# Patient Record
Sex: Female | Born: 1950 | Race: Black or African American | Hispanic: No
Health system: Southern US, Community
[De-identification: ages and names within clinical notes are randomized; demographics above are authoritative.]

## PROBLEM LIST (undated history)

## (undated) DIAGNOSIS — I10 Essential (primary) hypertension: Secondary | ICD-10-CM

## (undated) DIAGNOSIS — N289 Disorder of kidney and ureter, unspecified: Secondary | ICD-10-CM

---

## 2017-12-30 ENCOUNTER — Emergency Department (HOSPITAL_COMMUNITY): Payer: Medicare PPO

## 2017-12-30 ENCOUNTER — Emergency Department (HOSPITAL_COMMUNITY)
Admission: EM | Admit: 2017-12-30 | Discharge: 2017-12-30 | Disposition: A | Discharge status: Home / Self Care | Payer: Medicare PPO | Attending: Physician Assistant | Admitting: Physician Assistant

## 2017-12-30 ENCOUNTER — Encounter (HOSPITAL_COMMUNITY): Payer: Self-pay | Admitting: Emergency Medicine

## 2017-12-30 DIAGNOSIS — I10 Essential (primary) hypertension: Secondary | ICD-10-CM | POA: Diagnosis not present

## 2017-12-30 DIAGNOSIS — M25562 Pain in left knee: Secondary | ICD-10-CM

## 2017-12-30 HISTORY — DX: Essential (primary) hypertension: I10

## 2017-12-30 HISTORY — DX: Disorder of kidney and ureter, unspecified: N28.9

## 2017-12-30 NOTE — ED Triage Notes (Signed)
Patient presents to ED for assessment after a trip and fall where patient landed on her left knee.  Now states she cannot bear  Weight on it.  Minor swelling noted

## 2017-12-30 NOTE — Discharge Instructions (Signed)
It was my pleasure taking care of you today!   Tylenol as needed for pain. Rest the knee. Ice and elevate knee throughout the day.  If symptoms are not improving by Wednesday or Thursday, call the orthopedist (bone) doctor listed to schedule a follow up appointment.  Return to the ER for new or worsening symptoms, any additional concerns.

## 2017-12-30 NOTE — ED Provider Notes (Signed)
[facility] EMERGENCY [facility]PARTMENT Provider Note   CSN: [id number] Arrival date & time: 12/30/17  1451     History   Chief Complaint Chief Complaint  Patient presents with  . Knee Pain    HPI Roxan Dieselda Thilges is a 67 y.o. female.  The history is provided by the patient and medical records. No language interpreter was used.  Knee Pain     Christella Scheuermannda Samuel BoucheLucas is a 67 y.o. female  with a PMH of HTN who presents to the Emergency Department complaining of acute onset of left knee pain after she tripped today.  She states that her knee twisted oddly.  She did not fall to the ground.  She did not hit her head.  No loss of consciousness.  No medications taken prior to arrival for her symptoms.  Has not tried to walk on the leg since tripping.  Denies any swelling.  No medications taken prior to arrival for symptoms.  Denies history of prior injuries to the left lower extremity.  No numbness or tingling.  No weakness.   Past Medical History:  Diagnosis Date  . Hypertension   . Renal disorder    Kidney transplant 2013    There are no active problems to display for this patient.   History reviewed. No pertinent surgical history.   OB History   None      Home Medications    Prior to Admission medications   Not on File    Family History History reviewed. No pertinent family history.  Social History Social History   Tobacco Use  . Smoking status: Never Smoker  . Smokeless tobacco: Never Used  Substance Use Topics  . Alcohol use: Never    Frequency: Never  . Drug use: Never     Allergies   Aspirin and Penicillins   Review of Systems Review of Systems  Musculoskeletal: Positive for arthralgias. Negative for joint swelling.  Skin: Negative for color change and wound.  Neurological: Negative for syncope, weakness and headaches.    Physical Exam Updated Vital Signs BP (!) 152/73 (BP Location: Left Arm)   Pulse 81   Temp 98.4 F (36.9 C) (Oral)   Resp 16    SpO2 100%   Physical Exam  Constitutional: She appears well-developed and well-nourished. No distress.  HENT:  Head: Normocephalic and atraumatic.  Neck: Neck supple.  Cardiovascular: Normal rate, regular rhythm and normal heart sounds.  No murmur heard. Pulmonary/Chest: Effort normal and breath sounds normal. No respiratory distress. She has no wheezes. She has no rales.  Musculoskeletal:  Tenderness palpation of the left medial knee. Minimal swelling noted.  No tenderness to hips or ankle. Full range of motion and 5/5 strength. No abnormal alignment or patellar mobility. No bruising, erythema or warmth overlaying the joint. No varus/valgus laxity. Negative drawer's, Lachman's and McMurray's.  No crepitus. 2+ DP pulses bilaterally. All compartments are soft. Sensation intact distal to injury.  Neurological: She is alert.  Skin: Skin is warm and dry.  Nursing note and vitals reviewed.    ED Treatments / Results  Labs (all labs ordered are listed, but only abnormal results are displayed) Labs Reviewed - No data to display  EKG None  Radiology Dg Knee Complete 4 Views Left  Result Date: 12/30/2017 CLINICAL DATA:  Left knee pain following a fall on a carpeted floor today. EXAM: LEFT KNEE - COMPLETE 4+ VIEW COMPARISON:  None. FINDINGS: Moderate anterior patellar spur formation. No fracture, dislocation or effusion. IMPRESSION:  No fracture. Electronically Signed   By: Beckie Salts M.D.   On: 12/30/2017 15:53    Procedures Procedures (including critical care time)  Medications Ordered in ED Medications - No data to display   Initial Impression / Assessment and Plan / ED Course  I have reviewed the triage vital signs and the nursing notes.  Pertinent labs & imaging results that were available during my care of the patient were reviewed by me and considered in my medical decision making (see chart for details).    Janyia Guion is a 67 y.o. female who presents to ED for acute onset  of left knee pain after twisting knee oddly when she tripped just prior to arrival.  Neurovascularly intact on exam.  Ligaments appear intact on exam.  X-ray negative.  Patient with full range of motion and 5/5 strength.  Symptomatic home care instructions discussed.  Orthopedic follow-up if symptoms persist.  Reasons to return to ER discussed and all questions answered.   Final Clinical Impressions(s) / ED Diagnoses   Final diagnoses:  Acute pain of left knee    ED Discharge Orders    None       Keigan Tafoya, Chase Picket, PA-C 12/30/17 1643    Abelino Derrick, MD 01/02/18 [phone]

## 2018-07-31 DIAGNOSIS — Z79899 Other long term (current) drug therapy: Secondary | ICD-10-CM | POA: Diagnosis not present

## 2018-07-31 DIAGNOSIS — I129 Hypertensive chronic kidney disease with stage 1 through stage 4 chronic kidney disease, or unspecified chronic kidney disease: Secondary | ICD-10-CM | POA: Diagnosis not present

## 2018-07-31 DIAGNOSIS — F209 Schizophrenia, unspecified: Secondary | ICD-10-CM | POA: Diagnosis not present

## 2018-07-31 DIAGNOSIS — E559 Vitamin D deficiency, unspecified: Secondary | ICD-10-CM | POA: Diagnosis not present

## 2018-07-31 DIAGNOSIS — D631 Anemia in chronic kidney disease: Secondary | ICD-10-CM | POA: Diagnosis not present

## 2018-07-31 DIAGNOSIS — Z940 Kidney transplant status: Secondary | ICD-10-CM | POA: Diagnosis not present

## 2018-07-31 DIAGNOSIS — E1129 Type 2 diabetes mellitus with other diabetic kidney complication: Secondary | ICD-10-CM | POA: Diagnosis not present

## 2018-07-31 DIAGNOSIS — N189 Chronic kidney disease, unspecified: Secondary | ICD-10-CM | POA: Diagnosis not present

## 2018-08-01 DIAGNOSIS — F0390 Unspecified dementia without behavioral disturbance: Secondary | ICD-10-CM | POA: Diagnosis not present

## 2018-08-01 DIAGNOSIS — F209 Schizophrenia, unspecified: Secondary | ICD-10-CM | POA: Diagnosis not present

## 2018-08-01 DIAGNOSIS — Z940 Kidney transplant status: Secondary | ICD-10-CM | POA: Diagnosis not present

## 2018-08-01 DIAGNOSIS — K219 Gastro-esophageal reflux disease without esophagitis: Secondary | ICD-10-CM | POA: Diagnosis not present

## 2018-08-01 DIAGNOSIS — D649 Anemia, unspecified: Secondary | ICD-10-CM | POA: Diagnosis not present

## 2018-08-01 DIAGNOSIS — N183 Chronic kidney disease, stage 3 (moderate): Secondary | ICD-10-CM | POA: Diagnosis not present

## 2018-08-16 DIAGNOSIS — Z940 Kidney transplant status: Secondary | ICD-10-CM | POA: Diagnosis not present

## 2018-08-16 DIAGNOSIS — N186 End stage renal disease: Secondary | ICD-10-CM | POA: Diagnosis not present

## 2018-08-19 DIAGNOSIS — Z940 Kidney transplant status: Secondary | ICD-10-CM | POA: Diagnosis not present

## 2018-08-28 DIAGNOSIS — F205 Residual schizophrenia: Secondary | ICD-10-CM | POA: Diagnosis not present

## 2018-08-29 DIAGNOSIS — R05 Cough: Secondary | ICD-10-CM | POA: Diagnosis not present

## 2018-08-29 DIAGNOSIS — N189 Chronic kidney disease, unspecified: Secondary | ICD-10-CM | POA: Diagnosis not present

## 2018-08-29 DIAGNOSIS — F0390 Unspecified dementia without behavioral disturbance: Secondary | ICD-10-CM | POA: Diagnosis not present

## 2018-08-29 DIAGNOSIS — F209 Schizophrenia, unspecified: Secondary | ICD-10-CM | POA: Diagnosis not present

## 2018-08-29 DIAGNOSIS — I10 Essential (primary) hypertension: Secondary | ICD-10-CM | POA: Diagnosis not present

## 2018-08-29 DIAGNOSIS — Z940 Kidney transplant status: Secondary | ICD-10-CM | POA: Diagnosis not present

## 2018-09-26 DIAGNOSIS — Z940 Kidney transplant status: Secondary | ICD-10-CM | POA: Diagnosis not present

## 2018-09-26 DIAGNOSIS — K219 Gastro-esophageal reflux disease without esophagitis: Secondary | ICD-10-CM | POA: Diagnosis not present

## 2018-09-26 DIAGNOSIS — F209 Schizophrenia, unspecified: Secondary | ICD-10-CM | POA: Diagnosis not present

## 2018-09-26 DIAGNOSIS — F0390 Unspecified dementia without behavioral disturbance: Secondary | ICD-10-CM | POA: Diagnosis not present

## 2018-09-26 DIAGNOSIS — J309 Allergic rhinitis, unspecified: Secondary | ICD-10-CM | POA: Diagnosis not present

## 2018-09-30 DIAGNOSIS — B351 Tinea unguium: Secondary | ICD-10-CM | POA: Diagnosis not present

## 2018-09-30 DIAGNOSIS — I739 Peripheral vascular disease, unspecified: Secondary | ICD-10-CM | POA: Diagnosis not present

## 2018-10-16 DIAGNOSIS — Z940 Kidney transplant status: Secondary | ICD-10-CM | POA: Diagnosis not present

## 2018-10-21 DIAGNOSIS — E559 Vitamin D deficiency, unspecified: Secondary | ICD-10-CM | POA: Diagnosis not present

## 2018-10-21 DIAGNOSIS — I129 Hypertensive chronic kidney disease with stage 1 through stage 4 chronic kidney disease, or unspecified chronic kidney disease: Secondary | ICD-10-CM | POA: Diagnosis not present

## 2018-10-21 DIAGNOSIS — F329 Major depressive disorder, single episode, unspecified: Secondary | ICD-10-CM | POA: Diagnosis not present

## 2018-10-21 DIAGNOSIS — F209 Schizophrenia, unspecified: Secondary | ICD-10-CM | POA: Diagnosis not present

## 2018-10-21 DIAGNOSIS — E1129 Type 2 diabetes mellitus with other diabetic kidney complication: Secondary | ICD-10-CM | POA: Diagnosis not present

## 2018-10-21 DIAGNOSIS — Z940 Kidney transplant status: Secondary | ICD-10-CM | POA: Diagnosis not present

## 2018-10-21 DIAGNOSIS — D72819 Decreased white blood cell count, unspecified: Secondary | ICD-10-CM | POA: Diagnosis not present

## 2018-10-21 DIAGNOSIS — Z79899 Other long term (current) drug therapy: Secondary | ICD-10-CM | POA: Diagnosis not present

## 2018-10-21 DIAGNOSIS — N189 Chronic kidney disease, unspecified: Secondary | ICD-10-CM | POA: Diagnosis not present

## 2018-11-25 DIAGNOSIS — Z940 Kidney transplant status: Secondary | ICD-10-CM | POA: Diagnosis not present

## 2018-11-28 DIAGNOSIS — Z940 Kidney transplant status: Secondary | ICD-10-CM | POA: Diagnosis not present

## 2018-11-28 DIAGNOSIS — I10 Essential (primary) hypertension: Secondary | ICD-10-CM | POA: Diagnosis not present

## 2018-11-28 DIAGNOSIS — F200 Paranoid schizophrenia: Secondary | ICD-10-CM | POA: Diagnosis not present

## 2018-11-28 DIAGNOSIS — N182 Chronic kidney disease, stage 2 (mild): Secondary | ICD-10-CM | POA: Diagnosis not present

## 2018-11-28 DIAGNOSIS — F0390 Unspecified dementia without behavioral disturbance: Secondary | ICD-10-CM | POA: Diagnosis not present

## 2018-12-04 DIAGNOSIS — F205 Residual schizophrenia: Secondary | ICD-10-CM | POA: Diagnosis not present

## 2018-12-12 DIAGNOSIS — F205 Residual schizophrenia: Secondary | ICD-10-CM | POA: Diagnosis not present

## 2018-12-16 DIAGNOSIS — Z940 Kidney transplant status: Secondary | ICD-10-CM | POA: Diagnosis not present

## 2019-01-02 DIAGNOSIS — F0390 Unspecified dementia without behavioral disturbance: Secondary | ICD-10-CM | POA: Diagnosis not present

## 2019-01-02 DIAGNOSIS — I10 Essential (primary) hypertension: Secondary | ICD-10-CM | POA: Diagnosis not present

## 2019-01-02 DIAGNOSIS — Z940 Kidney transplant status: Secondary | ICD-10-CM | POA: Diagnosis not present

## 2019-01-02 DIAGNOSIS — N182 Chronic kidney disease, stage 2 (mild): Secondary | ICD-10-CM | POA: Diagnosis not present

## 2019-01-02 DIAGNOSIS — F209 Schizophrenia, unspecified: Secondary | ICD-10-CM | POA: Diagnosis not present

## 2019-01-30 DIAGNOSIS — N182 Chronic kidney disease, stage 2 (mild): Secondary | ICD-10-CM | POA: Diagnosis not present

## 2019-01-30 DIAGNOSIS — F209 Schizophrenia, unspecified: Secondary | ICD-10-CM | POA: Diagnosis not present

## 2019-01-30 DIAGNOSIS — I10 Essential (primary) hypertension: Secondary | ICD-10-CM | POA: Diagnosis not present

## 2019-01-30 DIAGNOSIS — Z940 Kidney transplant status: Secondary | ICD-10-CM | POA: Diagnosis not present

## 2019-01-30 DIAGNOSIS — F0390 Unspecified dementia without behavioral disturbance: Secondary | ICD-10-CM | POA: Diagnosis not present

## 2019-02-14 DIAGNOSIS — Z940 Kidney transplant status: Secondary | ICD-10-CM | POA: Diagnosis not present

## 2019-02-25 DIAGNOSIS — Z1383 Encounter for screening for respiratory disorder NEC: Secondary | ICD-10-CM | POA: Diagnosis not present

## 2019-02-25 DIAGNOSIS — Z20828 Contact with and (suspected) exposure to other viral communicable diseases: Secondary | ICD-10-CM | POA: Diagnosis not present

## 2019-03-24 DIAGNOSIS — Z03818 Encounter for observation for suspected exposure to other biological agents ruled out: Secondary | ICD-10-CM | POA: Diagnosis not present

## 2019-03-26 DIAGNOSIS — E559 Vitamin D deficiency, unspecified: Secondary | ICD-10-CM | POA: Diagnosis not present

## 2019-03-26 DIAGNOSIS — F329 Major depressive disorder, single episode, unspecified: Secondary | ICD-10-CM | POA: Diagnosis not present

## 2019-03-26 DIAGNOSIS — E1129 Type 2 diabetes mellitus with other diabetic kidney complication: Secondary | ICD-10-CM | POA: Diagnosis not present

## 2019-03-26 DIAGNOSIS — Z79899 Other long term (current) drug therapy: Secondary | ICD-10-CM | POA: Diagnosis not present

## 2019-03-26 DIAGNOSIS — Z940 Kidney transplant status: Secondary | ICD-10-CM | POA: Diagnosis not present

## 2019-03-26 DIAGNOSIS — D72819 Decreased white blood cell count, unspecified: Secondary | ICD-10-CM | POA: Diagnosis not present

## 2019-03-26 DIAGNOSIS — F209 Schizophrenia, unspecified: Secondary | ICD-10-CM | POA: Diagnosis not present

## 2019-03-26 DIAGNOSIS — I129 Hypertensive chronic kidney disease with stage 1 through stage 4 chronic kidney disease, or unspecified chronic kidney disease: Secondary | ICD-10-CM | POA: Diagnosis not present

## 2019-03-28 DIAGNOSIS — F209 Schizophrenia, unspecified: Secondary | ICD-10-CM | POA: Diagnosis not present

## 2019-03-28 DIAGNOSIS — Z940 Kidney transplant status: Secondary | ICD-10-CM | POA: Diagnosis not present

## 2019-03-28 DIAGNOSIS — I10 Essential (primary) hypertension: Secondary | ICD-10-CM | POA: Diagnosis not present

## 2019-03-28 DIAGNOSIS — N182 Chronic kidney disease, stage 2 (mild): Secondary | ICD-10-CM | POA: Diagnosis not present

## 2019-03-28 DIAGNOSIS — F0390 Unspecified dementia without behavioral disturbance: Secondary | ICD-10-CM | POA: Diagnosis not present

## 2019-04-02 IMAGING — DX DG KNEE COMPLETE 4+V*L*
4 series · 4 of 4 positions shown · non-contrast
Comparison: None.

CLINICAL DATA: Left knee pain following a fall on a carpeted floor
today.

EXAM:
LEFT KNEE - COMPLETE 4+ VIEW

[t knee ap left]
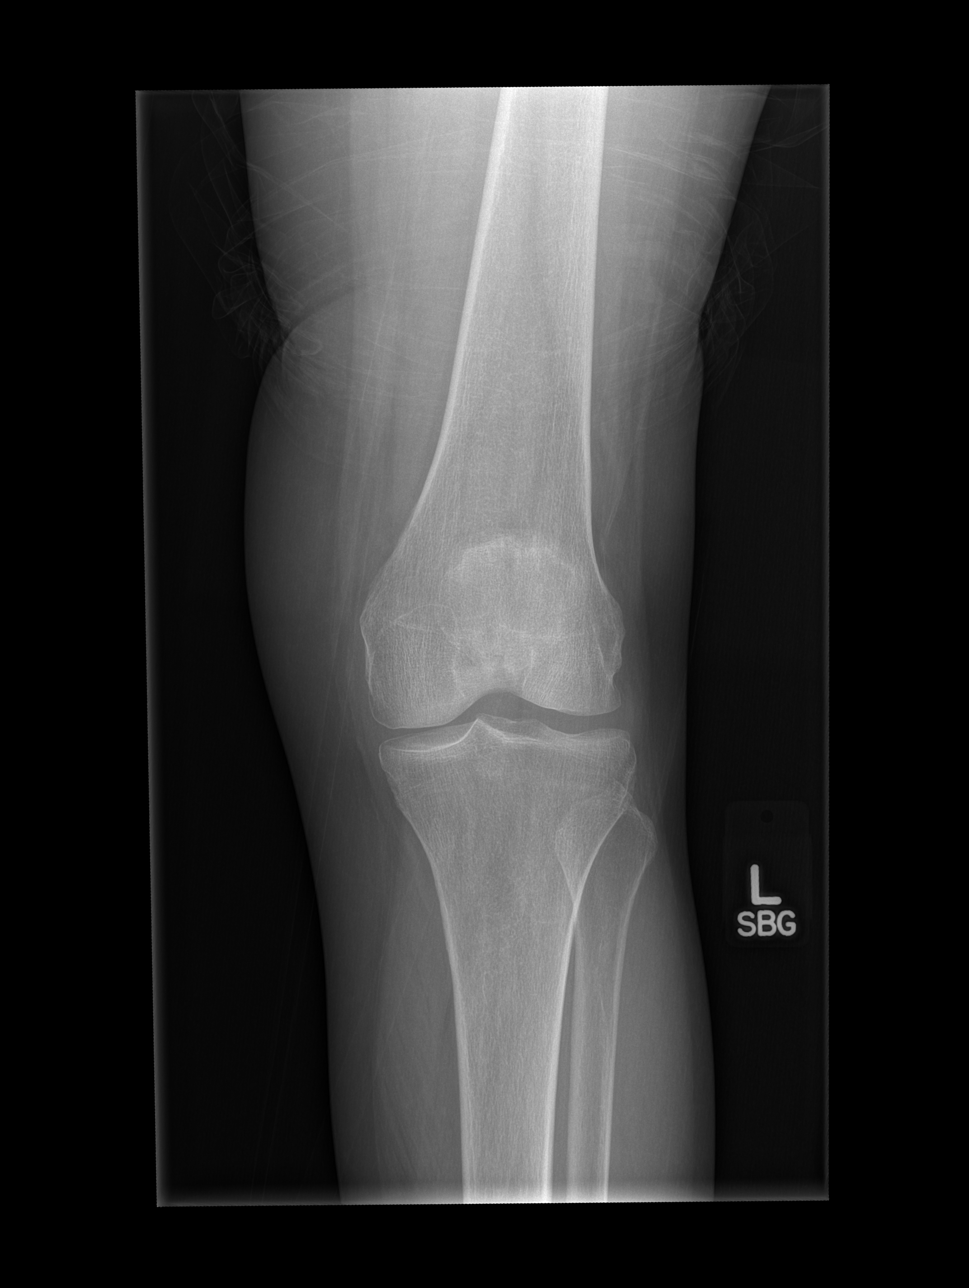

[t knee obl left (1 of 2)]
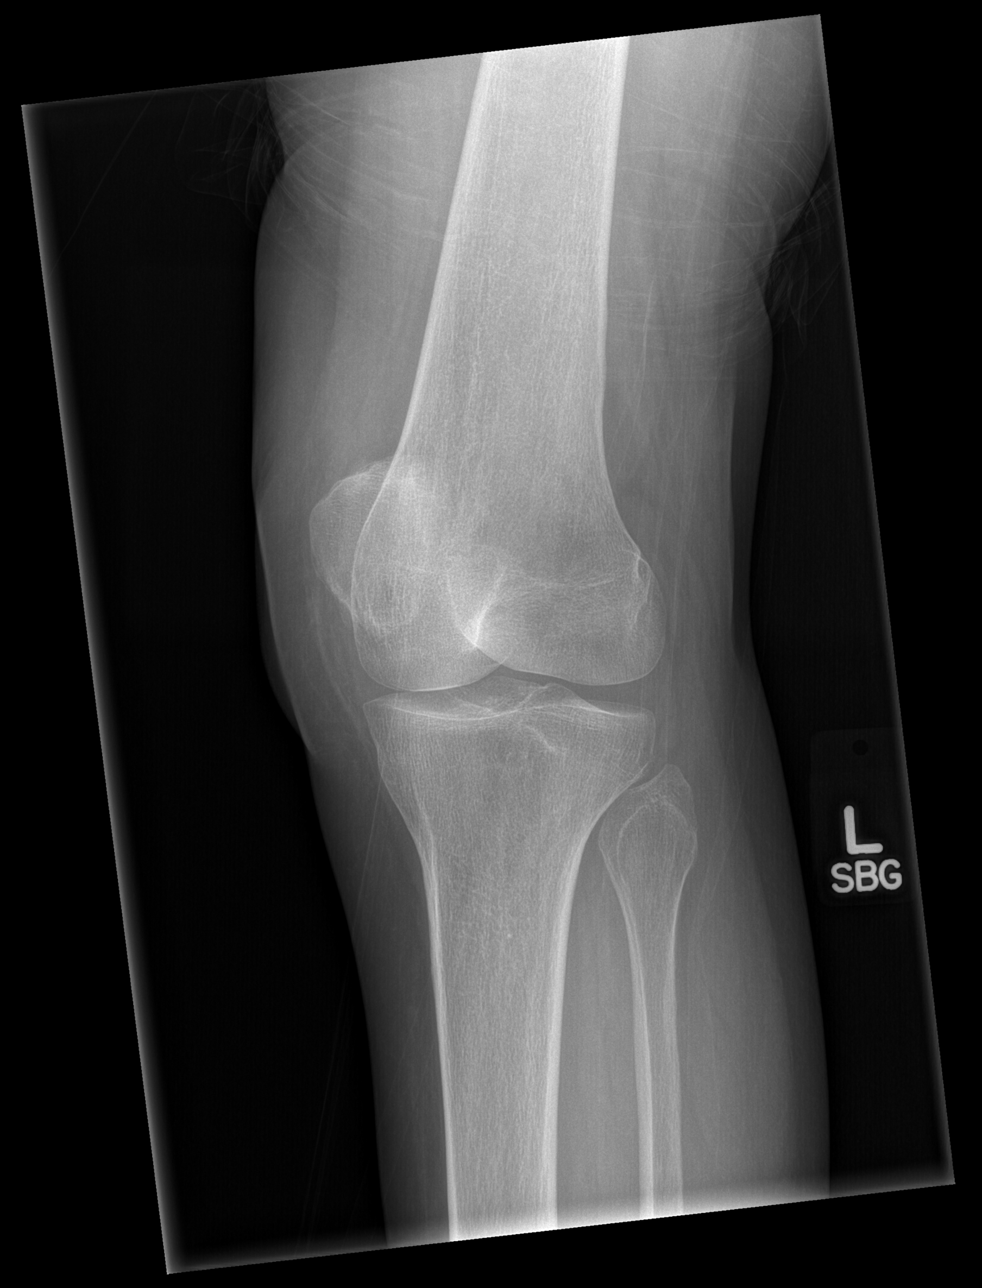

[t knee obl left (2 of 2)]
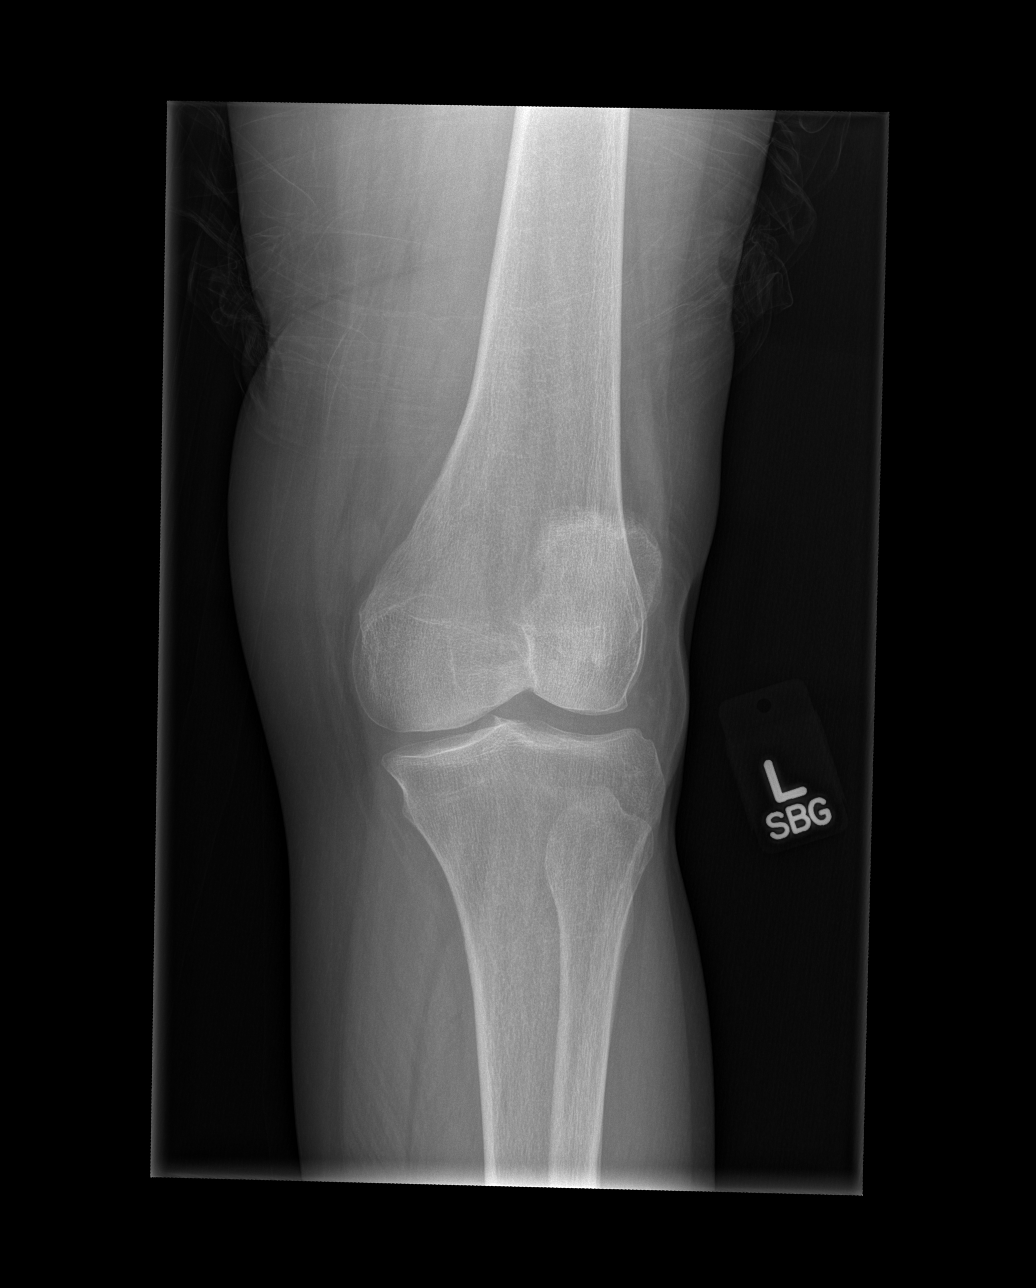

[t knee lat left]
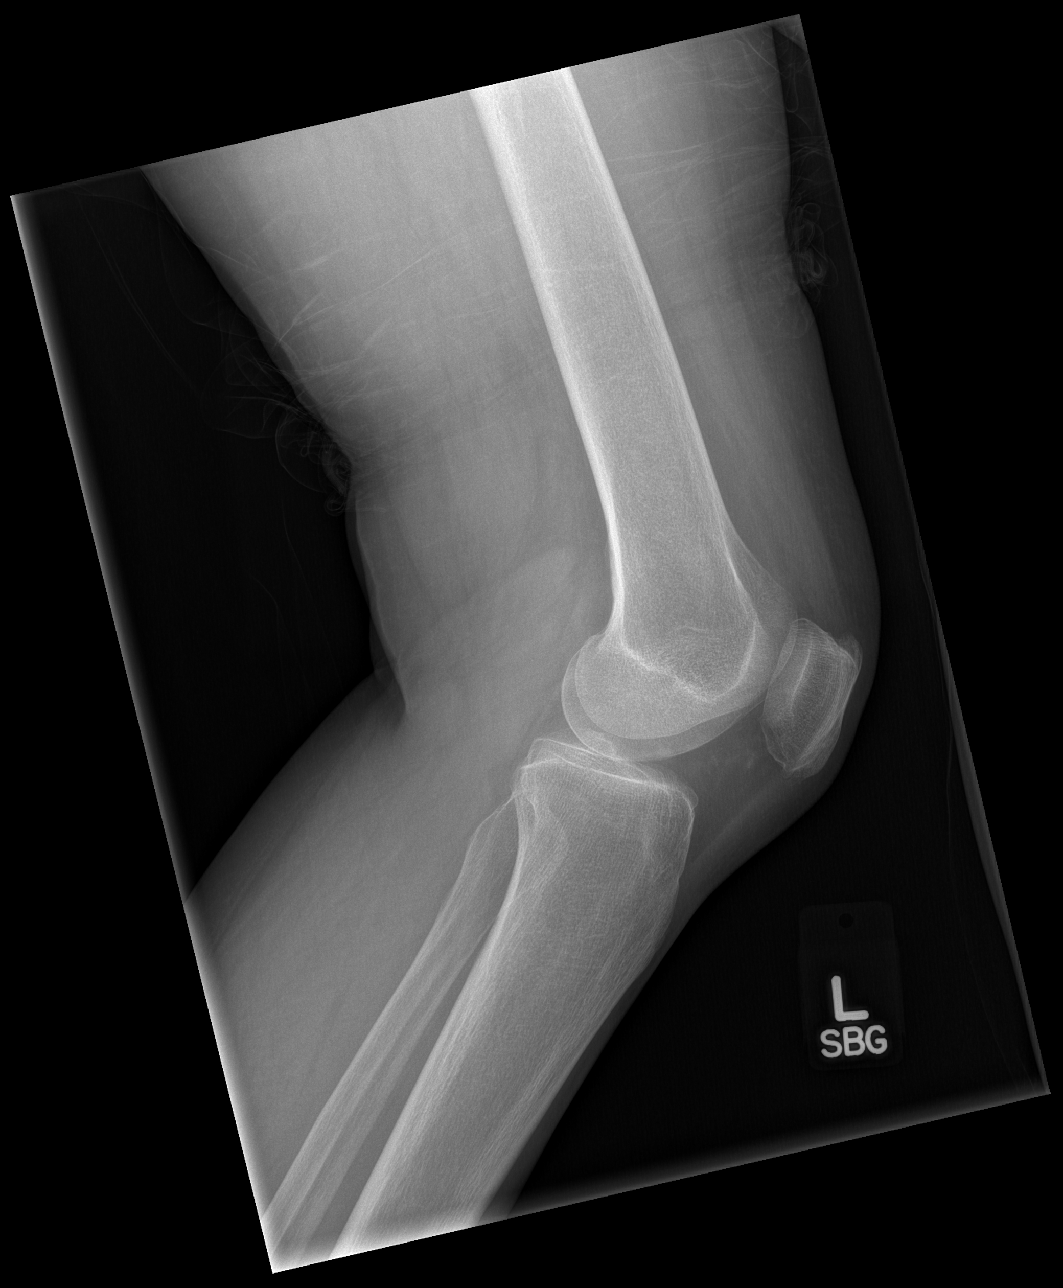

[4 of 4 positions shown; findings below may reference images not displayed]

FINDINGS: Moderate anterior patellar spur formation. No fracture, dislocation
or effusion.
IMPRESSION: No fracture.

## 2019-04-14 DIAGNOSIS — Z20828 Contact with and (suspected) exposure to other viral communicable diseases: Secondary | ICD-10-CM | POA: Diagnosis not present

## 2019-04-16 DIAGNOSIS — Z940 Kidney transplant status: Secondary | ICD-10-CM | POA: Diagnosis not present

## 2019-04-18 DIAGNOSIS — I739 Peripheral vascular disease, unspecified: Secondary | ICD-10-CM | POA: Diagnosis not present

## 2019-04-18 DIAGNOSIS — B351 Tinea unguium: Secondary | ICD-10-CM | POA: Diagnosis not present

## 2019-04-28 DIAGNOSIS — Z940 Kidney transplant status: Secondary | ICD-10-CM | POA: Diagnosis not present

## 2019-05-01 DIAGNOSIS — I10 Essential (primary) hypertension: Secondary | ICD-10-CM | POA: Diagnosis not present

## 2019-05-01 DIAGNOSIS — Z940 Kidney transplant status: Secondary | ICD-10-CM | POA: Diagnosis not present

## 2019-05-01 DIAGNOSIS — F0390 Unspecified dementia without behavioral disturbance: Secondary | ICD-10-CM | POA: Diagnosis not present

## 2019-05-01 DIAGNOSIS — F209 Schizophrenia, unspecified: Secondary | ICD-10-CM | POA: Diagnosis not present

## 2019-05-01 DIAGNOSIS — N182 Chronic kidney disease, stage 2 (mild): Secondary | ICD-10-CM | POA: Diagnosis not present

## 2019-05-07 DIAGNOSIS — F209 Schizophrenia, unspecified: Secondary | ICD-10-CM | POA: Diagnosis not present

## 2019-05-08 DIAGNOSIS — H43813 Vitreous degeneration, bilateral: Secondary | ICD-10-CM | POA: Diagnosis not present

## 2019-05-08 DIAGNOSIS — D3130 Benign neoplasm of unspecified choroid: Secondary | ICD-10-CM | POA: Diagnosis not present

## 2019-05-08 DIAGNOSIS — Z961 Presence of intraocular lens: Secondary | ICD-10-CM | POA: Diagnosis not present

## 2019-05-08 DIAGNOSIS — H04123 Dry eye syndrome of bilateral lacrimal glands: Secondary | ICD-10-CM | POA: Diagnosis not present

## 2019-05-26 DIAGNOSIS — Z03818 Encounter for observation for suspected exposure to other biological agents ruled out: Secondary | ICD-10-CM | POA: Diagnosis not present

## 2019-05-29 DIAGNOSIS — N182 Chronic kidney disease, stage 2 (mild): Secondary | ICD-10-CM | POA: Diagnosis not present

## 2019-05-29 DIAGNOSIS — F0390 Unspecified dementia without behavioral disturbance: Secondary | ICD-10-CM | POA: Diagnosis not present

## 2019-05-29 DIAGNOSIS — F209 Schizophrenia, unspecified: Secondary | ICD-10-CM | POA: Diagnosis not present

## 2019-05-29 DIAGNOSIS — Z940 Kidney transplant status: Secondary | ICD-10-CM | POA: Diagnosis not present

## 2019-05-29 DIAGNOSIS — I10 Essential (primary) hypertension: Secondary | ICD-10-CM | POA: Diagnosis not present

## 2019-05-30 DIAGNOSIS — Z940 Kidney transplant status: Secondary | ICD-10-CM | POA: Diagnosis not present

## 2019-06-10 DIAGNOSIS — Z03818 Encounter for observation for suspected exposure to other biological agents ruled out: Secondary | ICD-10-CM | POA: Diagnosis not present

## 2019-06-16 DIAGNOSIS — Z940 Kidney transplant status: Secondary | ICD-10-CM | POA: Diagnosis not present

## 2019-06-19 DIAGNOSIS — M2042 Other hammer toe(s) (acquired), left foot: Secondary | ICD-10-CM | POA: Diagnosis not present

## 2019-06-19 DIAGNOSIS — M2041 Other hammer toe(s) (acquired), right foot: Secondary | ICD-10-CM | POA: Diagnosis not present

## 2019-06-19 DIAGNOSIS — B351 Tinea unguium: Secondary | ICD-10-CM | POA: Diagnosis not present

## 2019-06-19 DIAGNOSIS — I739 Peripheral vascular disease, unspecified: Secondary | ICD-10-CM | POA: Diagnosis not present

## 2019-06-20 DIAGNOSIS — Z03818 Encounter for observation for suspected exposure to other biological agents ruled out: Secondary | ICD-10-CM | POA: Diagnosis not present

## 2019-06-21 DIAGNOSIS — Z03818 Encounter for observation for suspected exposure to other biological agents ruled out: Secondary | ICD-10-CM | POA: Diagnosis not present

## 2019-06-24 DIAGNOSIS — Z20828 Contact with and (suspected) exposure to other viral communicable diseases: Secondary | ICD-10-CM | POA: Diagnosis not present

## 2019-06-24 DIAGNOSIS — Z1383 Encounter for screening for respiratory disorder NEC: Secondary | ICD-10-CM | POA: Diagnosis not present

## 2019-06-26 DIAGNOSIS — N182 Chronic kidney disease, stage 2 (mild): Secondary | ICD-10-CM | POA: Diagnosis not present

## 2019-06-26 DIAGNOSIS — F209 Schizophrenia, unspecified: Secondary | ICD-10-CM | POA: Diagnosis not present

## 2019-06-26 DIAGNOSIS — F0390 Unspecified dementia without behavioral disturbance: Secondary | ICD-10-CM | POA: Diagnosis not present

## 2019-06-26 DIAGNOSIS — Z940 Kidney transplant status: Secondary | ICD-10-CM | POA: Diagnosis not present

## 2019-06-26 DIAGNOSIS — I10 Essential (primary) hypertension: Secondary | ICD-10-CM | POA: Diagnosis not present

## 2019-07-02 DIAGNOSIS — Z940 Kidney transplant status: Secondary | ICD-10-CM | POA: Diagnosis not present

## 2019-07-02 DIAGNOSIS — N186 End stage renal disease: Secondary | ICD-10-CM | POA: Diagnosis not present

## 2019-07-03 DIAGNOSIS — Z940 Kidney transplant status: Secondary | ICD-10-CM | POA: Diagnosis not present

## 2019-07-14 DIAGNOSIS — Z03818 Encounter for observation for suspected exposure to other biological agents ruled out: Secondary | ICD-10-CM | POA: Diagnosis not present

## 2019-07-16 DIAGNOSIS — F209 Schizophrenia, unspecified: Secondary | ICD-10-CM | POA: Diagnosis not present

## 2019-07-24 DIAGNOSIS — Z940 Kidney transplant status: Secondary | ICD-10-CM | POA: Diagnosis not present

## 2019-07-24 DIAGNOSIS — F0390 Unspecified dementia without behavioral disturbance: Secondary | ICD-10-CM | POA: Diagnosis not present

## 2019-07-24 DIAGNOSIS — F209 Schizophrenia, unspecified: Secondary | ICD-10-CM | POA: Diagnosis not present

## 2019-07-24 DIAGNOSIS — I10 Essential (primary) hypertension: Secondary | ICD-10-CM | POA: Diagnosis not present

## 2019-07-28 DIAGNOSIS — Z1383 Encounter for screening for respiratory disorder NEC: Secondary | ICD-10-CM | POA: Diagnosis not present

## 2019-07-28 DIAGNOSIS — Z20828 Contact with and (suspected) exposure to other viral communicable diseases: Secondary | ICD-10-CM | POA: Diagnosis not present

## 2019-08-04 DIAGNOSIS — Z1383 Encounter for screening for respiratory disorder NEC: Secondary | ICD-10-CM | POA: Diagnosis not present

## 2019-08-04 DIAGNOSIS — Z20828 Contact with and (suspected) exposure to other viral communicable diseases: Secondary | ICD-10-CM | POA: Diagnosis not present

## 2019-08-11 DIAGNOSIS — Z20828 Contact with and (suspected) exposure to other viral communicable diseases: Secondary | ICD-10-CM | POA: Diagnosis not present

## 2019-08-11 DIAGNOSIS — Z1383 Encounter for screening for respiratory disorder NEC: Secondary | ICD-10-CM | POA: Diagnosis not present

## 2019-08-18 DIAGNOSIS — Z940 Kidney transplant status: Secondary | ICD-10-CM | POA: Diagnosis not present

## 2019-08-18 DIAGNOSIS — Z1383 Encounter for screening for respiratory disorder NEC: Secondary | ICD-10-CM | POA: Diagnosis not present

## 2019-08-18 DIAGNOSIS — Z20828 Contact with and (suspected) exposure to other viral communicable diseases: Secondary | ICD-10-CM | POA: Diagnosis not present

## 2019-08-21 DIAGNOSIS — F209 Schizophrenia, unspecified: Secondary | ICD-10-CM | POA: Diagnosis not present

## 2019-08-21 DIAGNOSIS — F0390 Unspecified dementia without behavioral disturbance: Secondary | ICD-10-CM | POA: Diagnosis not present

## 2019-08-21 DIAGNOSIS — I10 Essential (primary) hypertension: Secondary | ICD-10-CM | POA: Diagnosis not present

## 2019-08-21 DIAGNOSIS — Z940 Kidney transplant status: Secondary | ICD-10-CM | POA: Diagnosis not present

## 2019-08-25 DIAGNOSIS — Z940 Kidney transplant status: Secondary | ICD-10-CM | POA: Diagnosis not present

## 2019-08-25 DIAGNOSIS — N186 End stage renal disease: Secondary | ICD-10-CM | POA: Diagnosis not present

## 2019-09-01 DIAGNOSIS — Z20828 Contact with and (suspected) exposure to other viral communicable diseases: Secondary | ICD-10-CM | POA: Diagnosis not present

## 2019-09-01 DIAGNOSIS — Z1383 Encounter for screening for respiratory disorder NEC: Secondary | ICD-10-CM | POA: Diagnosis not present

## 2019-09-02 DIAGNOSIS — E559 Vitamin D deficiency, unspecified: Secondary | ICD-10-CM | POA: Diagnosis not present

## 2019-09-02 DIAGNOSIS — Z940 Kidney transplant status: Secondary | ICD-10-CM | POA: Diagnosis not present

## 2019-09-02 DIAGNOSIS — F209 Schizophrenia, unspecified: Secondary | ICD-10-CM | POA: Diagnosis not present

## 2019-09-02 DIAGNOSIS — D72819 Decreased white blood cell count, unspecified: Secondary | ICD-10-CM | POA: Diagnosis not present

## 2019-09-02 DIAGNOSIS — E1129 Type 2 diabetes mellitus with other diabetic kidney complication: Secondary | ICD-10-CM | POA: Diagnosis not present

## 2019-09-02 DIAGNOSIS — F329 Major depressive disorder, single episode, unspecified: Secondary | ICD-10-CM | POA: Diagnosis not present

## 2019-09-02 DIAGNOSIS — I129 Hypertensive chronic kidney disease with stage 1 through stage 4 chronic kidney disease, or unspecified chronic kidney disease: Secondary | ICD-10-CM | POA: Diagnosis not present

## 2019-09-02 DIAGNOSIS — Z79899 Other long term (current) drug therapy: Secondary | ICD-10-CM | POA: Diagnosis not present

## 2019-09-14 DIAGNOSIS — Z1383 Encounter for screening for respiratory disorder NEC: Secondary | ICD-10-CM | POA: Diagnosis not present

## 2019-09-14 DIAGNOSIS — Z20828 Contact with and (suspected) exposure to other viral communicable diseases: Secondary | ICD-10-CM | POA: Diagnosis not present

## 2019-09-21 DIAGNOSIS — Z1383 Encounter for screening for respiratory disorder NEC: Secondary | ICD-10-CM | POA: Diagnosis not present

## 2019-09-21 DIAGNOSIS — Z20828 Contact with and (suspected) exposure to other viral communicable diseases: Secondary | ICD-10-CM | POA: Diagnosis not present

## 2019-09-22 DIAGNOSIS — F29 Unspecified psychosis not due to a substance or known physiological condition: Secondary | ICD-10-CM | POA: Diagnosis not present

## 2019-09-23 DIAGNOSIS — L602 Onychogryphosis: Secondary | ICD-10-CM | POA: Diagnosis not present

## 2019-09-23 DIAGNOSIS — I739 Peripheral vascular disease, unspecified: Secondary | ICD-10-CM | POA: Diagnosis not present

## 2019-09-23 DIAGNOSIS — L84 Corns and callosities: Secondary | ICD-10-CM | POA: Diagnosis not present

## 2019-09-25 DIAGNOSIS — Z940 Kidney transplant status: Secondary | ICD-10-CM | POA: Diagnosis not present

## 2019-09-25 DIAGNOSIS — F209 Schizophrenia, unspecified: Secondary | ICD-10-CM | POA: Diagnosis not present

## 2019-09-25 DIAGNOSIS — I10 Essential (primary) hypertension: Secondary | ICD-10-CM | POA: Diagnosis not present

## 2019-09-25 DIAGNOSIS — N182 Chronic kidney disease, stage 2 (mild): Secondary | ICD-10-CM | POA: Diagnosis not present

## 2019-09-25 DIAGNOSIS — F0390 Unspecified dementia without behavioral disturbance: Secondary | ICD-10-CM | POA: Diagnosis not present

## 2019-09-28 DIAGNOSIS — Z1383 Encounter for screening for respiratory disorder NEC: Secondary | ICD-10-CM | POA: Diagnosis not present

## 2019-09-28 DIAGNOSIS — Z20828 Contact with and (suspected) exposure to other viral communicable diseases: Secondary | ICD-10-CM | POA: Diagnosis not present

## 2019-10-13 DIAGNOSIS — Z20828 Contact with and (suspected) exposure to other viral communicable diseases: Secondary | ICD-10-CM | POA: Diagnosis not present

## 2019-10-17 DIAGNOSIS — Z940 Kidney transplant status: Secondary | ICD-10-CM | POA: Diagnosis not present

## 2019-10-19 DIAGNOSIS — Z20828 Contact with and (suspected) exposure to other viral communicable diseases: Secondary | ICD-10-CM | POA: Diagnosis not present

## 2019-10-22 DIAGNOSIS — F205 Residual schizophrenia: Secondary | ICD-10-CM | POA: Diagnosis not present

## 2019-10-23 DIAGNOSIS — Z940 Kidney transplant status: Secondary | ICD-10-CM | POA: Diagnosis not present

## 2019-10-23 DIAGNOSIS — I10 Essential (primary) hypertension: Secondary | ICD-10-CM | POA: Diagnosis not present

## 2019-10-23 DIAGNOSIS — F0390 Unspecified dementia without behavioral disturbance: Secondary | ICD-10-CM | POA: Diagnosis not present

## 2019-10-23 DIAGNOSIS — F209 Schizophrenia, unspecified: Secondary | ICD-10-CM | POA: Diagnosis not present

## 2019-10-28 DIAGNOSIS — Z20828 Contact with and (suspected) exposure to other viral communicable diseases: Secondary | ICD-10-CM | POA: Diagnosis not present

## 2019-11-02 DIAGNOSIS — Z20828 Contact with and (suspected) exposure to other viral communicable diseases: Secondary | ICD-10-CM | POA: Diagnosis not present

## 2019-11-02 DIAGNOSIS — Z1383 Encounter for screening for respiratory disorder NEC: Secondary | ICD-10-CM | POA: Diagnosis not present

## 2019-11-10 DIAGNOSIS — Z20828 Contact with and (suspected) exposure to other viral communicable diseases: Secondary | ICD-10-CM | POA: Diagnosis not present

## 2019-11-10 DIAGNOSIS — Z1383 Encounter for screening for respiratory disorder NEC: Secondary | ICD-10-CM | POA: Diagnosis not present

## 2019-11-16 DIAGNOSIS — Z20828 Contact with and (suspected) exposure to other viral communicable diseases: Secondary | ICD-10-CM | POA: Diagnosis not present

## 2019-11-20 DIAGNOSIS — F209 Schizophrenia, unspecified: Secondary | ICD-10-CM | POA: Diagnosis not present

## 2019-11-20 DIAGNOSIS — I10 Essential (primary) hypertension: Secondary | ICD-10-CM | POA: Diagnosis not present

## 2019-11-20 DIAGNOSIS — F0390 Unspecified dementia without behavioral disturbance: Secondary | ICD-10-CM | POA: Diagnosis not present

## 2019-11-20 DIAGNOSIS — Z940 Kidney transplant status: Secondary | ICD-10-CM | POA: Diagnosis not present

## 2019-11-24 DIAGNOSIS — Z940 Kidney transplant status: Secondary | ICD-10-CM | POA: Diagnosis not present

## 2019-12-15 DIAGNOSIS — M2041 Other hammer toe(s) (acquired), right foot: Secondary | ICD-10-CM | POA: Diagnosis not present

## 2019-12-15 DIAGNOSIS — L603 Nail dystrophy: Secondary | ICD-10-CM | POA: Diagnosis not present

## 2019-12-15 DIAGNOSIS — Z940 Kidney transplant status: Secondary | ICD-10-CM | POA: Diagnosis not present

## 2019-12-15 DIAGNOSIS — B351 Tinea unguium: Secondary | ICD-10-CM | POA: Diagnosis not present

## 2019-12-15 DIAGNOSIS — I739 Peripheral vascular disease, unspecified: Secondary | ICD-10-CM | POA: Diagnosis not present

## 2019-12-15 DIAGNOSIS — M2042 Other hammer toe(s) (acquired), left foot: Secondary | ICD-10-CM | POA: Diagnosis not present

## 2019-12-25 DIAGNOSIS — F0390 Unspecified dementia without behavioral disturbance: Secondary | ICD-10-CM | POA: Diagnosis not present

## 2019-12-25 DIAGNOSIS — Z940 Kidney transplant status: Secondary | ICD-10-CM | POA: Diagnosis not present

## 2019-12-25 DIAGNOSIS — I10 Essential (primary) hypertension: Secondary | ICD-10-CM | POA: Diagnosis not present

## 2019-12-25 DIAGNOSIS — F209 Schizophrenia, unspecified: Secondary | ICD-10-CM | POA: Diagnosis not present

## 2020-01-09 DIAGNOSIS — D72819 Decreased white blood cell count, unspecified: Secondary | ICD-10-CM | POA: Diagnosis not present

## 2020-01-09 DIAGNOSIS — F209 Schizophrenia, unspecified: Secondary | ICD-10-CM | POA: Diagnosis not present

## 2020-01-09 DIAGNOSIS — I129 Hypertensive chronic kidney disease with stage 1 through stage 4 chronic kidney disease, or unspecified chronic kidney disease: Secondary | ICD-10-CM | POA: Diagnosis not present

## 2020-01-09 DIAGNOSIS — Z79899 Other long term (current) drug therapy: Secondary | ICD-10-CM | POA: Diagnosis not present

## 2020-01-09 DIAGNOSIS — E1129 Type 2 diabetes mellitus with other diabetic kidney complication: Secondary | ICD-10-CM | POA: Diagnosis not present

## 2020-01-09 DIAGNOSIS — Z940 Kidney transplant status: Secondary | ICD-10-CM | POA: Diagnosis not present

## 2020-01-09 DIAGNOSIS — E559 Vitamin D deficiency, unspecified: Secondary | ICD-10-CM | POA: Diagnosis not present

## 2020-01-09 DIAGNOSIS — F329 Major depressive disorder, single episode, unspecified: Secondary | ICD-10-CM | POA: Diagnosis not present

## 2020-01-14 DIAGNOSIS — Z940 Kidney transplant status: Secondary | ICD-10-CM | POA: Diagnosis not present

## 2020-01-29 DIAGNOSIS — I10 Essential (primary) hypertension: Secondary | ICD-10-CM | POA: Diagnosis not present

## 2020-01-29 DIAGNOSIS — Z940 Kidney transplant status: Secondary | ICD-10-CM | POA: Diagnosis not present

## 2020-01-29 DIAGNOSIS — F0390 Unspecified dementia without behavioral disturbance: Secondary | ICD-10-CM | POA: Diagnosis not present

## 2020-01-29 DIAGNOSIS — F209 Schizophrenia, unspecified: Secondary | ICD-10-CM | POA: Diagnosis not present

## 2020-02-12 DIAGNOSIS — F0390 Unspecified dementia without behavioral disturbance: Secondary | ICD-10-CM | POA: Diagnosis not present

## 2020-02-12 DIAGNOSIS — F205 Residual schizophrenia: Secondary | ICD-10-CM | POA: Diagnosis not present

## 2020-02-16 DIAGNOSIS — Z940 Kidney transplant status: Secondary | ICD-10-CM | POA: Diagnosis not present

## 2020-02-26 DIAGNOSIS — Z940 Kidney transplant status: Secondary | ICD-10-CM | POA: Diagnosis not present

## 2020-02-26 DIAGNOSIS — F0390 Unspecified dementia without behavioral disturbance: Secondary | ICD-10-CM | POA: Diagnosis not present

## 2020-02-26 DIAGNOSIS — F209 Schizophrenia, unspecified: Secondary | ICD-10-CM | POA: Diagnosis not present

## 2020-02-26 DIAGNOSIS — I10 Essential (primary) hypertension: Secondary | ICD-10-CM | POA: Diagnosis not present

## 2020-03-04 DIAGNOSIS — F0391 Unspecified dementia with behavioral disturbance: Secondary | ICD-10-CM | POA: Diagnosis not present

## 2020-03-04 DIAGNOSIS — F205 Residual schizophrenia: Secondary | ICD-10-CM | POA: Diagnosis not present

## 2020-03-12 DIAGNOSIS — Z940 Kidney transplant status: Secondary | ICD-10-CM | POA: Diagnosis not present

## 2020-03-17 DIAGNOSIS — L603 Nail dystrophy: Secondary | ICD-10-CM | POA: Diagnosis not present

## 2020-03-17 DIAGNOSIS — M2142 Flat foot [pes planus] (acquired), left foot: Secondary | ICD-10-CM | POA: Diagnosis not present

## 2020-03-17 DIAGNOSIS — I739 Peripheral vascular disease, unspecified: Secondary | ICD-10-CM | POA: Diagnosis not present

## 2020-03-17 DIAGNOSIS — B351 Tinea unguium: Secondary | ICD-10-CM | POA: Diagnosis not present

## 2020-03-17 DIAGNOSIS — M2141 Flat foot [pes planus] (acquired), right foot: Secondary | ICD-10-CM | POA: Diagnosis not present

## 2020-04-01 DIAGNOSIS — Z940 Kidney transplant status: Secondary | ICD-10-CM | POA: Diagnosis not present

## 2020-04-01 DIAGNOSIS — F0390 Unspecified dementia without behavioral disturbance: Secondary | ICD-10-CM | POA: Diagnosis not present

## 2020-04-01 DIAGNOSIS — I10 Essential (primary) hypertension: Secondary | ICD-10-CM | POA: Diagnosis not present

## 2020-04-01 DIAGNOSIS — F209 Schizophrenia, unspecified: Secondary | ICD-10-CM | POA: Diagnosis not present

## 2020-04-16 DIAGNOSIS — Z940 Kidney transplant status: Secondary | ICD-10-CM | POA: Diagnosis not present

## 2020-04-20 DIAGNOSIS — F22 Delusional disorders: Secondary | ICD-10-CM | POA: Diagnosis not present

## 2020-04-20 DIAGNOSIS — F205 Residual schizophrenia: Secondary | ICD-10-CM | POA: Diagnosis not present

## 2020-04-20 DIAGNOSIS — F411 Generalized anxiety disorder: Secondary | ICD-10-CM | POA: Diagnosis not present

## 2020-04-20 DIAGNOSIS — F0391 Unspecified dementia with behavioral disturbance: Secondary | ICD-10-CM | POA: Diagnosis not present

## 2020-05-05 DIAGNOSIS — Z940 Kidney transplant status: Secondary | ICD-10-CM | POA: Diagnosis not present

## 2020-05-06 DIAGNOSIS — I10 Essential (primary) hypertension: Secondary | ICD-10-CM | POA: Diagnosis not present

## 2020-05-06 DIAGNOSIS — F209 Schizophrenia, unspecified: Secondary | ICD-10-CM | POA: Diagnosis not present

## 2020-05-06 DIAGNOSIS — Z940 Kidney transplant status: Secondary | ICD-10-CM | POA: Diagnosis not present

## 2020-05-06 DIAGNOSIS — F0390 Unspecified dementia without behavioral disturbance: Secondary | ICD-10-CM | POA: Diagnosis not present

## 2020-05-11 DIAGNOSIS — Z1383 Encounter for screening for respiratory disorder NEC: Secondary | ICD-10-CM | POA: Diagnosis not present

## 2020-05-11 DIAGNOSIS — Z20828 Contact with and (suspected) exposure to other viral communicable diseases: Secondary | ICD-10-CM | POA: Diagnosis not present

## 2020-05-20 DIAGNOSIS — I129 Hypertensive chronic kidney disease with stage 1 through stage 4 chronic kidney disease, or unspecified chronic kidney disease: Secondary | ICD-10-CM | POA: Diagnosis not present

## 2020-05-20 DIAGNOSIS — F329 Major depressive disorder, single episode, unspecified: Secondary | ICD-10-CM | POA: Diagnosis not present

## 2020-05-20 DIAGNOSIS — F209 Schizophrenia, unspecified: Secondary | ICD-10-CM | POA: Diagnosis not present

## 2020-05-20 DIAGNOSIS — D72819 Decreased white blood cell count, unspecified: Secondary | ICD-10-CM | POA: Diagnosis not present

## 2020-05-20 DIAGNOSIS — E559 Vitamin D deficiency, unspecified: Secondary | ICD-10-CM | POA: Diagnosis not present

## 2020-05-20 DIAGNOSIS — Z940 Kidney transplant status: Secondary | ICD-10-CM | POA: Diagnosis not present

## 2020-05-20 DIAGNOSIS — Z79899 Other long term (current) drug therapy: Secondary | ICD-10-CM | POA: Diagnosis not present

## 2020-05-20 DIAGNOSIS — E1129 Type 2 diabetes mellitus with other diabetic kidney complication: Secondary | ICD-10-CM | POA: Diagnosis not present

## 2020-05-31 DIAGNOSIS — Z1383 Encounter for screening for respiratory disorder NEC: Secondary | ICD-10-CM | POA: Diagnosis not present

## 2020-05-31 DIAGNOSIS — Z20828 Contact with and (suspected) exposure to other viral communicable diseases: Secondary | ICD-10-CM | POA: Diagnosis not present

## 2020-06-03 DIAGNOSIS — Z940 Kidney transplant status: Secondary | ICD-10-CM | POA: Diagnosis not present

## 2020-06-03 DIAGNOSIS — I10 Essential (primary) hypertension: Secondary | ICD-10-CM | POA: Diagnosis not present

## 2020-06-03 DIAGNOSIS — F0390 Unspecified dementia without behavioral disturbance: Secondary | ICD-10-CM | POA: Diagnosis not present

## 2020-06-03 DIAGNOSIS — F209 Schizophrenia, unspecified: Secondary | ICD-10-CM | POA: Diagnosis not present

## 2020-06-07 DIAGNOSIS — Z03818 Encounter for observation for suspected exposure to other biological agents ruled out: Secondary | ICD-10-CM | POA: Diagnosis not present

## 2020-06-13 DIAGNOSIS — Z20822 Contact with and (suspected) exposure to covid-19: Secondary | ICD-10-CM | POA: Diagnosis not present

## 2020-06-16 DIAGNOSIS — Z940 Kidney transplant status: Secondary | ICD-10-CM | POA: Diagnosis not present

## 2020-06-21 DIAGNOSIS — Z20822 Contact with and (suspected) exposure to covid-19: Secondary | ICD-10-CM | POA: Diagnosis not present

## 2020-06-24 DIAGNOSIS — N182 Chronic kidney disease, stage 2 (mild): Secondary | ICD-10-CM | POA: Diagnosis not present

## 2020-06-24 DIAGNOSIS — F209 Schizophrenia, unspecified: Secondary | ICD-10-CM | POA: Diagnosis not present

## 2020-06-24 DIAGNOSIS — I10 Essential (primary) hypertension: Secondary | ICD-10-CM | POA: Diagnosis not present

## 2020-06-24 DIAGNOSIS — R443 Hallucinations, unspecified: Secondary | ICD-10-CM | POA: Diagnosis not present

## 2020-06-24 DIAGNOSIS — Z940 Kidney transplant status: Secondary | ICD-10-CM | POA: Diagnosis not present

## 2020-06-24 DIAGNOSIS — F0390 Unspecified dementia without behavioral disturbance: Secondary | ICD-10-CM | POA: Diagnosis not present

## 2020-06-28 DIAGNOSIS — L603 Nail dystrophy: Secondary | ICD-10-CM | POA: Diagnosis not present

## 2020-06-28 DIAGNOSIS — Z20822 Contact with and (suspected) exposure to covid-19: Secondary | ICD-10-CM | POA: Diagnosis not present

## 2020-06-28 DIAGNOSIS — B351 Tinea unguium: Secondary | ICD-10-CM | POA: Diagnosis not present

## 2020-06-28 DIAGNOSIS — M2042 Other hammer toe(s) (acquired), left foot: Secondary | ICD-10-CM | POA: Diagnosis not present

## 2020-06-28 DIAGNOSIS — M2142 Flat foot [pes planus] (acquired), left foot: Secondary | ICD-10-CM | POA: Diagnosis not present

## 2020-06-28 DIAGNOSIS — I739 Peripheral vascular disease, unspecified: Secondary | ICD-10-CM | POA: Diagnosis not present

## 2020-06-28 DIAGNOSIS — M2141 Flat foot [pes planus] (acquired), right foot: Secondary | ICD-10-CM | POA: Diagnosis not present

## 2020-07-05 DIAGNOSIS — Z20822 Contact with and (suspected) exposure to covid-19: Secondary | ICD-10-CM | POA: Diagnosis not present

## 2020-07-12 DIAGNOSIS — Z20822 Contact with and (suspected) exposure to covid-19: Secondary | ICD-10-CM | POA: Diagnosis not present

## 2020-07-19 DIAGNOSIS — Z20822 Contact with and (suspected) exposure to covid-19: Secondary | ICD-10-CM | POA: Diagnosis not present

## 2020-07-22 DIAGNOSIS — R443 Hallucinations, unspecified: Secondary | ICD-10-CM | POA: Diagnosis not present

## 2020-07-22 DIAGNOSIS — F0390 Unspecified dementia without behavioral disturbance: Secondary | ICD-10-CM | POA: Diagnosis not present

## 2020-07-22 DIAGNOSIS — F209 Schizophrenia, unspecified: Secondary | ICD-10-CM | POA: Diagnosis not present

## 2020-07-22 DIAGNOSIS — Z940 Kidney transplant status: Secondary | ICD-10-CM | POA: Diagnosis not present

## 2020-07-22 DIAGNOSIS — I10 Essential (primary) hypertension: Secondary | ICD-10-CM | POA: Diagnosis not present

## 2020-07-22 DIAGNOSIS — N182 Chronic kidney disease, stage 2 (mild): Secondary | ICD-10-CM | POA: Diagnosis not present

## 2020-07-26 DIAGNOSIS — Z20822 Contact with and (suspected) exposure to covid-19: Secondary | ICD-10-CM | POA: Diagnosis not present

## 2020-08-02 DIAGNOSIS — Z20822 Contact with and (suspected) exposure to covid-19: Secondary | ICD-10-CM | POA: Diagnosis not present

## 2020-08-12 DIAGNOSIS — F200 Paranoid schizophrenia: Secondary | ICD-10-CM | POA: Diagnosis not present

## 2020-08-12 DIAGNOSIS — F411 Generalized anxiety disorder: Secondary | ICD-10-CM | POA: Diagnosis not present

## 2020-08-12 DIAGNOSIS — F321 Major depressive disorder, single episode, moderate: Secondary | ICD-10-CM | POA: Diagnosis not present

## 2020-08-16 DIAGNOSIS — Z940 Kidney transplant status: Secondary | ICD-10-CM | POA: Diagnosis not present

## 2020-08-18 DIAGNOSIS — Z940 Kidney transplant status: Secondary | ICD-10-CM | POA: Diagnosis not present

## 2020-08-18 DIAGNOSIS — E1129 Type 2 diabetes mellitus with other diabetic kidney complication: Secondary | ICD-10-CM | POA: Diagnosis not present

## 2020-08-18 DIAGNOSIS — E559 Vitamin D deficiency, unspecified: Secondary | ICD-10-CM | POA: Diagnosis not present

## 2020-08-18 DIAGNOSIS — I129 Hypertensive chronic kidney disease with stage 1 through stage 4 chronic kidney disease, or unspecified chronic kidney disease: Secondary | ICD-10-CM | POA: Diagnosis not present

## 2020-08-18 DIAGNOSIS — D72819 Decreased white blood cell count, unspecified: Secondary | ICD-10-CM | POA: Diagnosis not present

## 2020-08-18 DIAGNOSIS — F209 Schizophrenia, unspecified: Secondary | ICD-10-CM | POA: Diagnosis not present

## 2020-08-18 DIAGNOSIS — Z79899 Other long term (current) drug therapy: Secondary | ICD-10-CM | POA: Diagnosis not present

## 2020-08-18 DIAGNOSIS — F329 Major depressive disorder, single episode, unspecified: Secondary | ICD-10-CM | POA: Diagnosis not present

## 2020-08-19 DIAGNOSIS — Z940 Kidney transplant status: Secondary | ICD-10-CM | POA: Diagnosis not present

## 2020-08-19 DIAGNOSIS — N182 Chronic kidney disease, stage 2 (mild): Secondary | ICD-10-CM | POA: Diagnosis not present

## 2020-08-19 DIAGNOSIS — R443 Hallucinations, unspecified: Secondary | ICD-10-CM | POA: Diagnosis not present

## 2020-08-19 DIAGNOSIS — F0390 Unspecified dementia without behavioral disturbance: Secondary | ICD-10-CM | POA: Diagnosis not present

## 2020-08-19 DIAGNOSIS — F209 Schizophrenia, unspecified: Secondary | ICD-10-CM | POA: Diagnosis not present

## 2020-08-19 DIAGNOSIS — I10 Essential (primary) hypertension: Secondary | ICD-10-CM | POA: Diagnosis not present

## 2020-08-31 DIAGNOSIS — Z20822 Contact with and (suspected) exposure to covid-19: Secondary | ICD-10-CM | POA: Diagnosis not present

## 2020-09-06 DIAGNOSIS — Z20822 Contact with and (suspected) exposure to covid-19: Secondary | ICD-10-CM | POA: Diagnosis not present

## 2020-09-14 DIAGNOSIS — F321 Major depressive disorder, single episode, moderate: Secondary | ICD-10-CM | POA: Diagnosis not present

## 2020-09-14 DIAGNOSIS — F200 Paranoid schizophrenia: Secondary | ICD-10-CM | POA: Diagnosis not present

## 2020-09-14 DIAGNOSIS — F411 Generalized anxiety disorder: Secondary | ICD-10-CM | POA: Diagnosis not present

## 2020-09-30 DIAGNOSIS — F0390 Unspecified dementia without behavioral disturbance: Secondary | ICD-10-CM | POA: Diagnosis not present

## 2020-09-30 DIAGNOSIS — Z940 Kidney transplant status: Secondary | ICD-10-CM | POA: Diagnosis not present

## 2020-09-30 DIAGNOSIS — N182 Chronic kidney disease, stage 2 (mild): Secondary | ICD-10-CM | POA: Diagnosis not present

## 2020-09-30 DIAGNOSIS — F209 Schizophrenia, unspecified: Secondary | ICD-10-CM | POA: Diagnosis not present

## 2020-09-30 DIAGNOSIS — R443 Hallucinations, unspecified: Secondary | ICD-10-CM | POA: Diagnosis not present

## 2020-09-30 DIAGNOSIS — I10 Essential (primary) hypertension: Secondary | ICD-10-CM | POA: Diagnosis not present

## 2020-10-05 DIAGNOSIS — Z20822 Contact with and (suspected) exposure to covid-19: Secondary | ICD-10-CM | POA: Diagnosis not present

## 2020-10-12 DIAGNOSIS — F321 Major depressive disorder, single episode, moderate: Secondary | ICD-10-CM | POA: Diagnosis not present

## 2020-10-12 DIAGNOSIS — F411 Generalized anxiety disorder: Secondary | ICD-10-CM | POA: Diagnosis not present

## 2020-10-12 DIAGNOSIS — F200 Paranoid schizophrenia: Secondary | ICD-10-CM | POA: Diagnosis not present

## 2020-10-13 DIAGNOSIS — Z940 Kidney transplant status: Secondary | ICD-10-CM | POA: Diagnosis not present

## 2020-10-13 DIAGNOSIS — N186 End stage renal disease: Secondary | ICD-10-CM | POA: Diagnosis not present

## 2020-10-15 DIAGNOSIS — Z940 Kidney transplant status: Secondary | ICD-10-CM | POA: Diagnosis not present

## 2020-10-18 DIAGNOSIS — I739 Peripheral vascular disease, unspecified: Secondary | ICD-10-CM | POA: Diagnosis not present

## 2020-10-18 DIAGNOSIS — L602 Onychogryphosis: Secondary | ICD-10-CM | POA: Diagnosis not present

## 2020-10-18 DIAGNOSIS — L84 Corns and callosities: Secondary | ICD-10-CM | POA: Diagnosis not present

## 2020-10-28 DIAGNOSIS — N182 Chronic kidney disease, stage 2 (mild): Secondary | ICD-10-CM | POA: Diagnosis not present

## 2020-10-28 DIAGNOSIS — Z940 Kidney transplant status: Secondary | ICD-10-CM | POA: Diagnosis not present

## 2020-10-28 DIAGNOSIS — R443 Hallucinations, unspecified: Secondary | ICD-10-CM | POA: Diagnosis not present

## 2020-10-28 DIAGNOSIS — I10 Essential (primary) hypertension: Secondary | ICD-10-CM | POA: Diagnosis not present

## 2020-10-28 DIAGNOSIS — F209 Schizophrenia, unspecified: Secondary | ICD-10-CM | POA: Diagnosis not present

## 2020-10-28 DIAGNOSIS — F0390 Unspecified dementia without behavioral disturbance: Secondary | ICD-10-CM | POA: Diagnosis not present

## 2020-11-09 DIAGNOSIS — F321 Major depressive disorder, single episode, moderate: Secondary | ICD-10-CM | POA: Diagnosis not present

## 2020-11-09 DIAGNOSIS — F411 Generalized anxiety disorder: Secondary | ICD-10-CM | POA: Diagnosis not present

## 2020-11-09 DIAGNOSIS — F200 Paranoid schizophrenia: Secondary | ICD-10-CM | POA: Diagnosis not present

## 2020-11-25 DIAGNOSIS — F0390 Unspecified dementia without behavioral disturbance: Secondary | ICD-10-CM | POA: Diagnosis not present

## 2020-11-25 DIAGNOSIS — Z940 Kidney transplant status: Secondary | ICD-10-CM | POA: Diagnosis not present

## 2020-11-25 DIAGNOSIS — I10 Essential (primary) hypertension: Secondary | ICD-10-CM | POA: Diagnosis not present

## 2020-11-25 DIAGNOSIS — F209 Schizophrenia, unspecified: Secondary | ICD-10-CM | POA: Diagnosis not present

## 2020-11-25 DIAGNOSIS — N182 Chronic kidney disease, stage 2 (mild): Secondary | ICD-10-CM | POA: Diagnosis not present

## 2020-11-25 DIAGNOSIS — R443 Hallucinations, unspecified: Secondary | ICD-10-CM | POA: Diagnosis not present

## 2020-12-16 DIAGNOSIS — Z940 Kidney transplant status: Secondary | ICD-10-CM | POA: Diagnosis not present

## 2020-12-16 DIAGNOSIS — F209 Schizophrenia, unspecified: Secondary | ICD-10-CM | POA: Diagnosis not present

## 2020-12-16 DIAGNOSIS — E559 Vitamin D deficiency, unspecified: Secondary | ICD-10-CM | POA: Diagnosis not present

## 2020-12-16 DIAGNOSIS — D72819 Decreased white blood cell count, unspecified: Secondary | ICD-10-CM | POA: Diagnosis not present

## 2020-12-16 DIAGNOSIS — E1129 Type 2 diabetes mellitus with other diabetic kidney complication: Secondary | ICD-10-CM | POA: Diagnosis not present

## 2020-12-16 DIAGNOSIS — Z79899 Other long term (current) drug therapy: Secondary | ICD-10-CM | POA: Diagnosis not present

## 2020-12-16 DIAGNOSIS — I129 Hypertensive chronic kidney disease with stage 1 through stage 4 chronic kidney disease, or unspecified chronic kidney disease: Secondary | ICD-10-CM | POA: Diagnosis not present

## 2020-12-16 DIAGNOSIS — F329 Major depressive disorder, single episode, unspecified: Secondary | ICD-10-CM | POA: Diagnosis not present

## 2020-12-17 DIAGNOSIS — Z940 Kidney transplant status: Secondary | ICD-10-CM | POA: Diagnosis not present

## 2020-12-22 DIAGNOSIS — H31002 Unspecified chorioretinal scars, left eye: Secondary | ICD-10-CM | POA: Diagnosis not present

## 2020-12-22 DIAGNOSIS — Z961 Presence of intraocular lens: Secondary | ICD-10-CM | POA: Diagnosis not present

## 2020-12-22 DIAGNOSIS — H524 Presbyopia: Secondary | ICD-10-CM | POA: Diagnosis not present

## 2020-12-22 DIAGNOSIS — H43813 Vitreous degeneration, bilateral: Secondary | ICD-10-CM | POA: Diagnosis not present

## 2020-12-30 DIAGNOSIS — N182 Chronic kidney disease, stage 2 (mild): Secondary | ICD-10-CM | POA: Diagnosis not present

## 2020-12-30 DIAGNOSIS — F209 Schizophrenia, unspecified: Secondary | ICD-10-CM | POA: Diagnosis not present

## 2020-12-30 DIAGNOSIS — Z940 Kidney transplant status: Secondary | ICD-10-CM | POA: Diagnosis not present

## 2020-12-30 DIAGNOSIS — R443 Hallucinations, unspecified: Secondary | ICD-10-CM | POA: Diagnosis not present

## 2020-12-30 DIAGNOSIS — F0390 Unspecified dementia without behavioral disturbance: Secondary | ICD-10-CM | POA: Diagnosis not present

## 2020-12-30 DIAGNOSIS — I10 Essential (primary) hypertension: Secondary | ICD-10-CM | POA: Diagnosis not present

## 2021-01-14 DIAGNOSIS — F321 Major depressive disorder, single episode, moderate: Secondary | ICD-10-CM | POA: Diagnosis not present

## 2021-01-14 DIAGNOSIS — F200 Paranoid schizophrenia: Secondary | ICD-10-CM | POA: Diagnosis not present

## 2021-01-14 DIAGNOSIS — F411 Generalized anxiety disorder: Secondary | ICD-10-CM | POA: Diagnosis not present

## 2021-01-27 DIAGNOSIS — F0390 Unspecified dementia without behavioral disturbance: Secondary | ICD-10-CM | POA: Diagnosis not present

## 2021-01-27 DIAGNOSIS — F209 Schizophrenia, unspecified: Secondary | ICD-10-CM | POA: Diagnosis not present

## 2021-01-27 DIAGNOSIS — Z940 Kidney transplant status: Secondary | ICD-10-CM | POA: Diagnosis not present

## 2021-01-27 DIAGNOSIS — R443 Hallucinations, unspecified: Secondary | ICD-10-CM | POA: Diagnosis not present

## 2021-01-27 DIAGNOSIS — N182 Chronic kidney disease, stage 2 (mild): Secondary | ICD-10-CM | POA: Diagnosis not present

## 2021-01-27 DIAGNOSIS — I10 Essential (primary) hypertension: Secondary | ICD-10-CM | POA: Diagnosis not present

## 2021-02-11 DIAGNOSIS — F200 Paranoid schizophrenia: Secondary | ICD-10-CM | POA: Diagnosis not present

## 2021-02-11 DIAGNOSIS — F411 Generalized anxiety disorder: Secondary | ICD-10-CM | POA: Diagnosis not present

## 2021-02-11 DIAGNOSIS — F321 Major depressive disorder, single episode, moderate: Secondary | ICD-10-CM | POA: Diagnosis not present

## 2021-02-16 DIAGNOSIS — Z940 Kidney transplant status: Secondary | ICD-10-CM | POA: Diagnosis not present

## 2021-02-21 DIAGNOSIS — N390 Urinary tract infection, site not specified: Secondary | ICD-10-CM | POA: Diagnosis not present

## 2021-03-04 DIAGNOSIS — F411 Generalized anxiety disorder: Secondary | ICD-10-CM | POA: Diagnosis not present

## 2021-03-04 DIAGNOSIS — F321 Major depressive disorder, single episode, moderate: Secondary | ICD-10-CM | POA: Diagnosis not present

## 2021-03-04 DIAGNOSIS — F200 Paranoid schizophrenia: Secondary | ICD-10-CM | POA: Diagnosis not present

## 2021-03-04 DIAGNOSIS — R419 Unspecified symptoms and signs involving cognitive functions and awareness: Secondary | ICD-10-CM | POA: Diagnosis not present

## 2021-03-10 DIAGNOSIS — Z940 Kidney transplant status: Secondary | ICD-10-CM | POA: Diagnosis not present

## 2021-03-10 DIAGNOSIS — F209 Schizophrenia, unspecified: Secondary | ICD-10-CM | POA: Diagnosis not present

## 2021-03-10 DIAGNOSIS — I10 Essential (primary) hypertension: Secondary | ICD-10-CM | POA: Diagnosis not present

## 2021-03-10 DIAGNOSIS — F0390 Unspecified dementia without behavioral disturbance: Secondary | ICD-10-CM | POA: Diagnosis not present

## 2021-03-25 DIAGNOSIS — R262 Difficulty in walking, not elsewhere classified: Secondary | ICD-10-CM | POA: Diagnosis not present

## 2021-03-25 DIAGNOSIS — B351 Tinea unguium: Secondary | ICD-10-CM | POA: Diagnosis not present

## 2021-03-25 DIAGNOSIS — M79675 Pain in left toe(s): Secondary | ICD-10-CM | POA: Diagnosis not present

## 2021-03-25 DIAGNOSIS — M79674 Pain in right toe(s): Secondary | ICD-10-CM | POA: Diagnosis not present

## 2021-03-31 DIAGNOSIS — Z940 Kidney transplant status: Secondary | ICD-10-CM | POA: Diagnosis not present

## 2021-03-31 DIAGNOSIS — F209 Schizophrenia, unspecified: Secondary | ICD-10-CM | POA: Diagnosis not present

## 2021-03-31 DIAGNOSIS — I10 Essential (primary) hypertension: Secondary | ICD-10-CM | POA: Diagnosis not present

## 2021-03-31 DIAGNOSIS — F0390 Unspecified dementia without behavioral disturbance: Secondary | ICD-10-CM | POA: Diagnosis not present

## 2021-04-15 DIAGNOSIS — F200 Paranoid schizophrenia: Secondary | ICD-10-CM | POA: Diagnosis not present

## 2021-04-15 DIAGNOSIS — R419 Unspecified symptoms and signs involving cognitive functions and awareness: Secondary | ICD-10-CM | POA: Diagnosis not present

## 2021-04-15 DIAGNOSIS — F321 Major depressive disorder, single episode, moderate: Secondary | ICD-10-CM | POA: Diagnosis not present

## 2021-04-15 DIAGNOSIS — Z940 Kidney transplant status: Secondary | ICD-10-CM | POA: Diagnosis not present

## 2021-04-15 DIAGNOSIS — F411 Generalized anxiety disorder: Secondary | ICD-10-CM | POA: Diagnosis not present

## 2021-04-28 DIAGNOSIS — Z940 Kidney transplant status: Secondary | ICD-10-CM | POA: Diagnosis not present

## 2021-04-28 DIAGNOSIS — F209 Schizophrenia, unspecified: Secondary | ICD-10-CM | POA: Diagnosis not present

## 2021-04-28 DIAGNOSIS — F0390 Unspecified dementia without behavioral disturbance: Secondary | ICD-10-CM | POA: Diagnosis not present

## 2021-04-28 DIAGNOSIS — I10 Essential (primary) hypertension: Secondary | ICD-10-CM | POA: Diagnosis not present

## 2021-04-28 DIAGNOSIS — N182 Chronic kidney disease, stage 2 (mild): Secondary | ICD-10-CM | POA: Diagnosis not present

## 2021-04-28 DIAGNOSIS — R443 Hallucinations, unspecified: Secondary | ICD-10-CM | POA: Diagnosis not present

## 2021-05-25 DIAGNOSIS — F32A Depression, unspecified: Secondary | ICD-10-CM | POA: Diagnosis not present

## 2021-05-25 DIAGNOSIS — Z940 Kidney transplant status: Secondary | ICD-10-CM | POA: Diagnosis not present

## 2021-05-25 DIAGNOSIS — I129 Hypertensive chronic kidney disease with stage 1 through stage 4 chronic kidney disease, or unspecified chronic kidney disease: Secondary | ICD-10-CM | POA: Diagnosis not present

## 2021-05-25 DIAGNOSIS — E1129 Type 2 diabetes mellitus with other diabetic kidney complication: Secondary | ICD-10-CM | POA: Diagnosis not present

## 2021-05-25 DIAGNOSIS — E559 Vitamin D deficiency, unspecified: Secondary | ICD-10-CM | POA: Diagnosis not present

## 2021-05-25 DIAGNOSIS — F209 Schizophrenia, unspecified: Secondary | ICD-10-CM | POA: Diagnosis not present

## 2021-05-25 DIAGNOSIS — Z79899 Other long term (current) drug therapy: Secondary | ICD-10-CM | POA: Diagnosis not present

## 2021-05-25 DIAGNOSIS — D72819 Decreased white blood cell count, unspecified: Secondary | ICD-10-CM | POA: Diagnosis not present

## 2021-05-27 DIAGNOSIS — R419 Unspecified symptoms and signs involving cognitive functions and awareness: Secondary | ICD-10-CM | POA: Diagnosis not present

## 2021-05-27 DIAGNOSIS — F321 Major depressive disorder, single episode, moderate: Secondary | ICD-10-CM | POA: Diagnosis not present

## 2021-05-27 DIAGNOSIS — F411 Generalized anxiety disorder: Secondary | ICD-10-CM | POA: Diagnosis not present

## 2021-05-27 DIAGNOSIS — F200 Paranoid schizophrenia: Secondary | ICD-10-CM | POA: Diagnosis not present

## 2021-06-02 DIAGNOSIS — F0390 Unspecified dementia without behavioral disturbance: Secondary | ICD-10-CM | POA: Diagnosis not present

## 2021-06-02 DIAGNOSIS — F209 Schizophrenia, unspecified: Secondary | ICD-10-CM | POA: Diagnosis not present

## 2021-06-02 DIAGNOSIS — Z940 Kidney transplant status: Secondary | ICD-10-CM | POA: Diagnosis not present

## 2021-06-02 DIAGNOSIS — I10 Essential (primary) hypertension: Secondary | ICD-10-CM | POA: Diagnosis not present

## 2021-06-10 DIAGNOSIS — F411 Generalized anxiety disorder: Secondary | ICD-10-CM | POA: Diagnosis not present

## 2021-06-10 DIAGNOSIS — F321 Major depressive disorder, single episode, moderate: Secondary | ICD-10-CM | POA: Diagnosis not present

## 2021-06-10 DIAGNOSIS — F200 Paranoid schizophrenia: Secondary | ICD-10-CM | POA: Diagnosis not present

## 2021-06-10 DIAGNOSIS — R419 Unspecified symptoms and signs involving cognitive functions and awareness: Secondary | ICD-10-CM | POA: Diagnosis not present

## 2021-06-15 DIAGNOSIS — M79675 Pain in left toe(s): Secondary | ICD-10-CM | POA: Diagnosis not present

## 2021-06-15 DIAGNOSIS — M79674 Pain in right toe(s): Secondary | ICD-10-CM | POA: Diagnosis not present

## 2021-06-15 DIAGNOSIS — Z940 Kidney transplant status: Secondary | ICD-10-CM | POA: Diagnosis not present

## 2021-06-15 DIAGNOSIS — R262 Difficulty in walking, not elsewhere classified: Secondary | ICD-10-CM | POA: Diagnosis not present

## 2021-06-15 DIAGNOSIS — B351 Tinea unguium: Secondary | ICD-10-CM | POA: Diagnosis not present

## 2021-06-30 DIAGNOSIS — I10 Essential (primary) hypertension: Secondary | ICD-10-CM | POA: Diagnosis not present

## 2021-06-30 DIAGNOSIS — Z940 Kidney transplant status: Secondary | ICD-10-CM | POA: Diagnosis not present

## 2021-06-30 DIAGNOSIS — F209 Schizophrenia, unspecified: Secondary | ICD-10-CM | POA: Diagnosis not present

## 2021-06-30 DIAGNOSIS — F0390 Unspecified dementia without behavioral disturbance: Secondary | ICD-10-CM | POA: Diagnosis not present

## 2021-07-08 DIAGNOSIS — F411 Generalized anxiety disorder: Secondary | ICD-10-CM | POA: Diagnosis not present

## 2021-07-08 DIAGNOSIS — F321 Major depressive disorder, single episode, moderate: Secondary | ICD-10-CM | POA: Diagnosis not present

## 2021-07-08 DIAGNOSIS — R419 Unspecified symptoms and signs involving cognitive functions and awareness: Secondary | ICD-10-CM | POA: Diagnosis not present

## 2021-07-08 DIAGNOSIS — F200 Paranoid schizophrenia: Secondary | ICD-10-CM | POA: Diagnosis not present

## 2021-07-22 DIAGNOSIS — F0390 Unspecified dementia without behavioral disturbance: Secondary | ICD-10-CM | POA: Diagnosis not present

## 2021-07-22 DIAGNOSIS — I10 Essential (primary) hypertension: Secondary | ICD-10-CM | POA: Diagnosis not present

## 2021-07-22 DIAGNOSIS — Z940 Kidney transplant status: Secondary | ICD-10-CM | POA: Diagnosis not present

## 2021-07-22 DIAGNOSIS — F209 Schizophrenia, unspecified: Secondary | ICD-10-CM | POA: Diagnosis not present

## 2021-07-23 DIAGNOSIS — R140 Abdominal distension (gaseous): Secondary | ICD-10-CM | POA: Diagnosis not present

## 2021-08-15 DIAGNOSIS — F200 Paranoid schizophrenia: Secondary | ICD-10-CM | POA: Diagnosis not present

## 2021-08-15 DIAGNOSIS — Z940 Kidney transplant status: Secondary | ICD-10-CM | POA: Diagnosis not present

## 2021-08-21 DIAGNOSIS — I10 Essential (primary) hypertension: Secondary | ICD-10-CM | POA: Diagnosis not present

## 2021-08-21 DIAGNOSIS — Z940 Kidney transplant status: Secondary | ICD-10-CM | POA: Diagnosis not present

## 2021-08-21 DIAGNOSIS — F0390 Unspecified dementia without behavioral disturbance: Secondary | ICD-10-CM | POA: Diagnosis not present

## 2021-08-21 DIAGNOSIS — F209 Schizophrenia, unspecified: Secondary | ICD-10-CM | POA: Diagnosis not present

## 2021-08-29 DIAGNOSIS — F200 Paranoid schizophrenia: Secondary | ICD-10-CM | POA: Diagnosis not present

## 2021-09-13 DIAGNOSIS — Z940 Kidney transplant status: Secondary | ICD-10-CM | POA: Diagnosis not present

## 2021-09-13 DIAGNOSIS — I129 Hypertensive chronic kidney disease with stage 1 through stage 4 chronic kidney disease, or unspecified chronic kidney disease: Secondary | ICD-10-CM | POA: Diagnosis not present

## 2021-09-13 DIAGNOSIS — F32A Depression, unspecified: Secondary | ICD-10-CM | POA: Diagnosis not present

## 2021-09-13 DIAGNOSIS — F209 Schizophrenia, unspecified: Secondary | ICD-10-CM | POA: Diagnosis not present

## 2021-09-13 DIAGNOSIS — Z7960 Long term (current) use of unspecified immunomodulators and immunosuppressants: Secondary | ICD-10-CM | POA: Diagnosis not present

## 2021-09-13 DIAGNOSIS — E1129 Type 2 diabetes mellitus with other diabetic kidney complication: Secondary | ICD-10-CM | POA: Diagnosis not present

## 2021-09-14 DIAGNOSIS — F200 Paranoid schizophrenia: Secondary | ICD-10-CM | POA: Diagnosis not present

## 2021-09-28 DIAGNOSIS — F200 Paranoid schizophrenia: Secondary | ICD-10-CM | POA: Diagnosis not present

## 2021-09-29 DIAGNOSIS — F209 Schizophrenia, unspecified: Secondary | ICD-10-CM | POA: Diagnosis not present

## 2021-09-29 DIAGNOSIS — Z940 Kidney transplant status: Secondary | ICD-10-CM | POA: Diagnosis not present

## 2021-09-29 DIAGNOSIS — F0390 Unspecified dementia without behavioral disturbance: Secondary | ICD-10-CM | POA: Diagnosis not present

## 2021-09-29 DIAGNOSIS — I10 Essential (primary) hypertension: Secondary | ICD-10-CM | POA: Diagnosis not present

## 2021-10-16 DIAGNOSIS — Z940 Kidney transplant status: Secondary | ICD-10-CM | POA: Diagnosis not present

## 2021-10-27 DIAGNOSIS — F209 Schizophrenia, unspecified: Secondary | ICD-10-CM | POA: Diagnosis not present

## 2021-10-27 DIAGNOSIS — I10 Essential (primary) hypertension: Secondary | ICD-10-CM | POA: Diagnosis not present

## 2021-10-27 DIAGNOSIS — Z940 Kidney transplant status: Secondary | ICD-10-CM | POA: Diagnosis not present

## 2021-10-27 DIAGNOSIS — F0390 Unspecified dementia without behavioral disturbance: Secondary | ICD-10-CM | POA: Diagnosis not present

## 2021-11-03 DIAGNOSIS — F200 Paranoid schizophrenia: Secondary | ICD-10-CM | POA: Diagnosis not present

## 2021-11-23 DIAGNOSIS — E119 Type 2 diabetes mellitus without complications: Secondary | ICD-10-CM | POA: Diagnosis not present

## 2021-11-23 DIAGNOSIS — E0921 Drug or chemical induced diabetes mellitus with diabetic nephropathy: Secondary | ICD-10-CM | POA: Diagnosis not present

## 2021-12-01 DIAGNOSIS — F200 Paranoid schizophrenia: Secondary | ICD-10-CM | POA: Diagnosis not present

## 2021-12-06 DIAGNOSIS — B351 Tinea unguium: Secondary | ICD-10-CM | POA: Diagnosis not present

## 2021-12-06 DIAGNOSIS — R262 Difficulty in walking, not elsewhere classified: Secondary | ICD-10-CM | POA: Diagnosis not present

## 2021-12-06 DIAGNOSIS — M79675 Pain in left toe(s): Secondary | ICD-10-CM | POA: Diagnosis not present

## 2021-12-06 DIAGNOSIS — M79674 Pain in right toe(s): Secondary | ICD-10-CM | POA: Diagnosis not present

## 2021-12-08 DIAGNOSIS — F411 Generalized anxiety disorder: Secondary | ICD-10-CM | POA: Diagnosis not present

## 2021-12-08 DIAGNOSIS — Z940 Kidney transplant status: Secondary | ICD-10-CM | POA: Diagnosis not present

## 2021-12-08 DIAGNOSIS — F339 Major depressive disorder, recurrent, unspecified: Secondary | ICD-10-CM | POA: Diagnosis not present

## 2021-12-08 DIAGNOSIS — I10 Essential (primary) hypertension: Secondary | ICD-10-CM | POA: Diagnosis not present

## 2021-12-14 DIAGNOSIS — Z940 Kidney transplant status: Secondary | ICD-10-CM | POA: Diagnosis not present

## 2021-12-20 DIAGNOSIS — F200 Paranoid schizophrenia: Secondary | ICD-10-CM | POA: Diagnosis not present

## 2022-01-03 DIAGNOSIS — F200 Paranoid schizophrenia: Secondary | ICD-10-CM | POA: Diagnosis not present

## 2022-01-05 DIAGNOSIS — Z940 Kidney transplant status: Secondary | ICD-10-CM | POA: Diagnosis not present

## 2022-01-05 DIAGNOSIS — F339 Major depressive disorder, recurrent, unspecified: Secondary | ICD-10-CM | POA: Diagnosis not present

## 2022-01-05 DIAGNOSIS — I10 Essential (primary) hypertension: Secondary | ICD-10-CM | POA: Diagnosis not present

## 2022-01-05 DIAGNOSIS — F411 Generalized anxiety disorder: Secondary | ICD-10-CM | POA: Diagnosis not present

## 2022-01-16 DIAGNOSIS — E559 Vitamin D deficiency, unspecified: Secondary | ICD-10-CM | POA: Diagnosis not present

## 2022-01-16 DIAGNOSIS — D72819 Decreased white blood cell count, unspecified: Secondary | ICD-10-CM | POA: Diagnosis not present

## 2022-01-16 DIAGNOSIS — F209 Schizophrenia, unspecified: Secondary | ICD-10-CM | POA: Diagnosis not present

## 2022-01-16 DIAGNOSIS — Z7960 Long term (current) use of unspecified immunomodulators and immunosuppressants: Secondary | ICD-10-CM | POA: Diagnosis not present

## 2022-01-16 DIAGNOSIS — I129 Hypertensive chronic kidney disease with stage 1 through stage 4 chronic kidney disease, or unspecified chronic kidney disease: Secondary | ICD-10-CM | POA: Diagnosis not present

## 2022-01-16 DIAGNOSIS — E1129 Type 2 diabetes mellitus with other diabetic kidney complication: Secondary | ICD-10-CM | POA: Diagnosis not present

## 2022-01-16 DIAGNOSIS — Z940 Kidney transplant status: Secondary | ICD-10-CM | POA: Diagnosis not present

## 2022-01-16 DIAGNOSIS — F32A Depression, unspecified: Secondary | ICD-10-CM | POA: Diagnosis not present

## 2022-01-18 DIAGNOSIS — F200 Paranoid schizophrenia: Secondary | ICD-10-CM | POA: Diagnosis not present

## 2022-02-02 DIAGNOSIS — F200 Paranoid schizophrenia: Secondary | ICD-10-CM | POA: Diagnosis not present

## 2022-02-13 DIAGNOSIS — H43813 Vitreous degeneration, bilateral: Secondary | ICD-10-CM | POA: Diagnosis not present

## 2022-02-13 DIAGNOSIS — H524 Presbyopia: Secondary | ICD-10-CM | POA: Diagnosis not present

## 2022-02-13 DIAGNOSIS — Z940 Kidney transplant status: Secondary | ICD-10-CM | POA: Diagnosis not present

## 2022-02-13 DIAGNOSIS — Z961 Presence of intraocular lens: Secondary | ICD-10-CM | POA: Diagnosis not present

## 2022-02-13 DIAGNOSIS — H31002 Unspecified chorioretinal scars, left eye: Secondary | ICD-10-CM | POA: Diagnosis not present

## 2022-02-15 DIAGNOSIS — F200 Paranoid schizophrenia: Secondary | ICD-10-CM | POA: Diagnosis not present

## 2022-02-17 DIAGNOSIS — Z940 Kidney transplant status: Secondary | ICD-10-CM | POA: Diagnosis not present

## 2022-02-17 DIAGNOSIS — F339 Major depressive disorder, recurrent, unspecified: Secondary | ICD-10-CM | POA: Diagnosis not present

## 2022-02-17 DIAGNOSIS — I10 Essential (primary) hypertension: Secondary | ICD-10-CM | POA: Diagnosis not present

## 2022-02-17 DIAGNOSIS — F411 Generalized anxiety disorder: Secondary | ICD-10-CM | POA: Diagnosis not present

## 2022-02-27 DIAGNOSIS — E0921 Drug or chemical induced diabetes mellitus with diabetic nephropathy: Secondary | ICD-10-CM | POA: Diagnosis not present

## 2022-02-27 DIAGNOSIS — Z940 Kidney transplant status: Secondary | ICD-10-CM | POA: Diagnosis not present

## 2022-03-01 DIAGNOSIS — F200 Paranoid schizophrenia: Secondary | ICD-10-CM | POA: Diagnosis not present

## 2022-03-13 DIAGNOSIS — F200 Paranoid schizophrenia: Secondary | ICD-10-CM | POA: Diagnosis not present

## 2022-03-13 DIAGNOSIS — F411 Generalized anxiety disorder: Secondary | ICD-10-CM | POA: Diagnosis not present

## 2022-03-13 DIAGNOSIS — I10 Essential (primary) hypertension: Secondary | ICD-10-CM | POA: Diagnosis not present

## 2022-03-13 DIAGNOSIS — K219 Gastro-esophageal reflux disease without esophagitis: Secondary | ICD-10-CM | POA: Diagnosis not present

## 2022-03-15 DIAGNOSIS — F200 Paranoid schizophrenia: Secondary | ICD-10-CM | POA: Diagnosis not present

## 2022-03-17 DIAGNOSIS — F411 Generalized anxiety disorder: Secondary | ICD-10-CM | POA: Diagnosis not present

## 2022-03-17 DIAGNOSIS — H6123 Impacted cerumen, bilateral: Secondary | ICD-10-CM | POA: Diagnosis not present

## 2022-03-17 DIAGNOSIS — K219 Gastro-esophageal reflux disease without esophagitis: Secondary | ICD-10-CM | POA: Diagnosis not present

## 2022-03-17 DIAGNOSIS — I10 Essential (primary) hypertension: Secondary | ICD-10-CM | POA: Diagnosis not present

## 2022-03-20 DIAGNOSIS — N390 Urinary tract infection, site not specified: Secondary | ICD-10-CM | POA: Diagnosis not present

## 2022-03-21 DIAGNOSIS — N390 Urinary tract infection, site not specified: Secondary | ICD-10-CM | POA: Diagnosis not present

## 2022-03-22 DIAGNOSIS — F200 Paranoid schizophrenia: Secondary | ICD-10-CM | POA: Diagnosis not present

## 2022-03-22 DIAGNOSIS — N390 Urinary tract infection, site not specified: Secondary | ICD-10-CM | POA: Diagnosis not present

## 2022-03-22 DIAGNOSIS — F05 Delirium due to known physiological condition: Secondary | ICD-10-CM | POA: Diagnosis not present

## 2022-04-05 DIAGNOSIS — F200 Paranoid schizophrenia: Secondary | ICD-10-CM | POA: Diagnosis not present

## 2022-04-06 DIAGNOSIS — K219 Gastro-esophageal reflux disease without esophagitis: Secondary | ICD-10-CM | POA: Diagnosis not present

## 2022-04-06 DIAGNOSIS — I10 Essential (primary) hypertension: Secondary | ICD-10-CM | POA: Diagnosis not present

## 2022-04-10 ENCOUNTER — Emergency Department (HOSPITAL_COMMUNITY)
Admission: EM | Admit: 2022-04-10 | Discharge: 2022-04-10 | Disposition: A | Discharge status: Home / Self Care | Payer: Medicare PPO | Attending: Emergency Medicine | Admitting: Emergency Medicine

## 2022-04-10 ENCOUNTER — Other Ambulatory Visit: Payer: Self-pay

## 2022-04-10 ENCOUNTER — Emergency Department (HOSPITAL_COMMUNITY): Payer: Medicare PPO

## 2022-04-10 DIAGNOSIS — M25552 Pain in left hip: Secondary | ICD-10-CM | POA: Diagnosis not present

## 2022-04-10 DIAGNOSIS — K529 Noninfective gastroenteritis and colitis, unspecified: Secondary | ICD-10-CM | POA: Diagnosis not present

## 2022-04-10 DIAGNOSIS — I10 Essential (primary) hypertension: Secondary | ICD-10-CM | POA: Insufficient documentation

## 2022-04-10 DIAGNOSIS — W19XXXA Unspecified fall, initial encounter: Secondary | ICD-10-CM | POA: Diagnosis not present

## 2022-04-10 DIAGNOSIS — M1711 Unilateral primary osteoarthritis, right knee: Secondary | ICD-10-CM | POA: Diagnosis not present

## 2022-04-10 DIAGNOSIS — M25562 Pain in left knee: Secondary | ICD-10-CM | POA: Diagnosis not present

## 2022-04-10 DIAGNOSIS — M11261 Other chondrocalcinosis, right knee: Secondary | ICD-10-CM | POA: Diagnosis not present

## 2022-04-10 DIAGNOSIS — M25551 Pain in right hip: Secondary | ICD-10-CM | POA: Diagnosis not present

## 2022-04-10 DIAGNOSIS — R6889 Other general symptoms and signs: Secondary | ICD-10-CM | POA: Diagnosis not present

## 2022-04-10 DIAGNOSIS — S79912A Unspecified injury of left hip, initial encounter: Secondary | ICD-10-CM | POA: Diagnosis not present

## 2022-04-10 DIAGNOSIS — M93262 Osteochondritis dissecans, left knee: Secondary | ICD-10-CM | POA: Diagnosis not present

## 2022-04-10 DIAGNOSIS — M79662 Pain in left lower leg: Secondary | ICD-10-CM | POA: Diagnosis not present

## 2022-04-10 DIAGNOSIS — Y92129 Unspecified place in nursing home as the place of occurrence of the external cause: Secondary | ICD-10-CM | POA: Diagnosis not present

## 2022-04-10 DIAGNOSIS — S79911A Unspecified injury of right hip, initial encounter: Secondary | ICD-10-CM | POA: Diagnosis not present

## 2022-04-10 DIAGNOSIS — W010XXA Fall on same level from slipping, tripping and stumbling without subsequent striking against object, initial encounter: Secondary | ICD-10-CM | POA: Diagnosis not present

## 2022-04-10 DIAGNOSIS — M1712 Unilateral primary osteoarthritis, left knee: Secondary | ICD-10-CM | POA: Diagnosis not present

## 2022-04-10 DIAGNOSIS — M25561 Pain in right knee: Secondary | ICD-10-CM | POA: Diagnosis not present

## 2022-04-10 NOTE — Discharge Instructions (Signed)
As discussed, today's evaluation has been generally reassuring.  You may have sustained a bruise to your pelvis or hip.  Less likely, the possible is that there is a subtle nondisplaced broken bone within the pelvis.  Please use Tylenol and ibuprofen for pain control.  In addition, for the next few days use a walker for assistance with walking.  Return here for concerning changes in your condition.

## 2022-04-10 NOTE — ED Triage Notes (Signed)
Pt arrives to ED BIB [facility] from [facility] due to a fall. Per EMS pt slipped on water and fell on her buttock yesterday morning; Pt states she was able to get up and walk but pain has progressed. Pt c/o bilateral hip pain but mainly on Left side Hip. Denies any LOC, Denies Blood Thinners. Pt A/O 4.   BP 144/84 HR 84 O2 98% RA CBG 143

## 2022-04-10 NOTE — ED Provider Notes (Signed)
[facility] EMERGENCY DEPARTMENT Provider Note   CSN: [id number] Arrival date & time: 04/10/22  2017     History  Chief Complaint  Patient presents with   Kristina Hughes is a 71 y.o. female.  HPI Patient presents with her daughter and family friend who assists with history.  Additional details are obtained by EMS, with whom I discussed the case prior to my evaluation. Patient is a nursing home resident, has some degree of chronic discomfort, but had a fall yesterday, about 36 hours ago.  She has been ambulatory since that time, though less so than usual.  She does complain of pain in the left hip.  No reported head trauma, the patient self denies any head pain, neck pain weakness in any extremity.  Seemingly she has not taken any medication for pain control.  EMS reports that staff initially thought the patient was unresponsive, but the patient acknowledges playing possum with staff.  Home Medications Prior to Admission medications   Not on File      Allergies    Aspirin and Penicillins    Review of Systems   Review of Systems  All other systems reviewed and are negative.   Physical Exam Updated Vital Signs There were no vitals taken for this visit. Physical Exam Vitals and nursing note reviewed.  Constitutional:      General: She is not in acute distress.    Appearance: She is well-developed.  HENT:     Head: Normocephalic and atraumatic.  Eyes:     Conjunctiva/sclera: Conjunctivae normal.  Cardiovascular:     Rate and Rhythm: Normal rate and regular rhythm.  Pulmonary:     Effort: Pulmonary effort is normal. No respiratory distress.     Breath sounds: Normal breath sounds. No stridor.  Abdominal:     General: There is no distension.  Musculoskeletal:       Legs:  Skin:    General: Skin is warm and dry.  Neurological:     Mental Status: She is alert and oriented to person, place, and time.     Cranial Nerves: No cranial nerve deficit.   Psychiatric:        Behavior: Behavior is withdrawn.        Cognition and Memory: Memory is impaired.     ED Results / Procedures / Treatments   Labs (all labs ordered are listed, but only abnormal results are displayed) Labs Reviewed - No data to display  EKG None  Radiology DG Hip Unilat With Pelvis 2-3 Views Right  Result Date: 04/10/2022 CLINICAL DATA:  Slip and fall injury. EXAM: DG HIP (WITH OR WITHOUT PELVIS) 2-3V RIGHT COMPARISON:  None Available. FINDINGS: Pelvis and right hip appear intact. No evidence of acute fracture or dislocation. No focal bone lesion or bone destruction. Mild degenerative changes with narrowing and sclerosis at the superior acetabular joint. SI joints and symphysis pubis are not displaced. Calcification in the mid pelvis likely represents calcified uterine fibroid. IMPRESSION: Mild degenerative changes in the right hip. No acute displaced fractures. Electronically Signed   By: Burman Nieves M.D.   On: 04/10/2022 20:51   DG Hip Unilat With Pelvis 2-3 Views Left  Result Date: 04/10/2022 CLINICAL DATA:  Slip and fall injury. EXAM: DG HIP (WITH OR WITHOUT PELVIS) 2-3V LEFT COMPARISON:  None Available. FINDINGS: Mild degenerative changes in the left hip with narrowing and sclerosis of the superior acetabular space and small osteophyte formation. Evidence of acute  fracture or dislocation. No focal bone lesion or bone destruction. Calcification in the mid pelvis likely representing a calcified fibroid. IMPRESSION: Mild degenerative changes in the left hip. No acute displaced fractures are identified. Electronically Signed   By: Burman Nieves M.D.   On: 04/10/2022 20:50    Procedures Procedures    Medications Ordered in ED Medications - No data to display  ED Course/ Medical Decision Making/ A&P This patient with a Hx of prior kidney transplant and hypertension presents to the ED for concern of pain following a fall that occurred about 36 hours ago,  this involves an extensive number of treatment options, and is a complaint that carries with it a high risk of complications and morbidity.    The differential diagnosis includes fracture, contusion, sprain versus strain   Social Determinants of Health:  Nursing home residency  Additional history obtained:  Additional history and/or information obtained from family members at bedside and included in HPI   After the initial evaluation, orders, including: X-rays were initiated.     On repeat evaluation of the patient improved Patient was ambulatory, can stand on her own, take steps  Imaging Studies ordered:  I independently visualized and interpreted imaging which showed no obvious fracture bilaterally I agree with the radiologist interpretation   Dispostion / Final MDM:  After consideration of the diagnostic results and the patient's response to treatment, this adult female with multiple medical issues presents after a fall that occurred yesterday with ongoing pain, reported decreased interactivity.  This latter complaint seems to be volitional which the patient acknowledges.  In regards the patient's pain and fall, x-rays were performed, no hip fractures.  Pelvis is similarly unremarkable.  After the patient was ambulatory here with family members we discussed possibilities including occult pelvic fracture, and they are comfortable with close outpatient follow-up as needed.  Patient discharged in stable condition.  Final Clinical Impression(s) / ED Diagnoses Final diagnoses:  Fall, initial encounter    Rx / DC Orders ED Discharge Orders          Ordered    For home use only DME Walker        04/10/22 2304              Gerhard Munch, MD 04/11/22 [phone]

## 2022-04-10 NOTE — ED Notes (Signed)
Pt's daughter taking pt back to facility.

## 2022-04-12 DIAGNOSIS — M170 Bilateral primary osteoarthritis of knee: Secondary | ICD-10-CM | POA: Diagnosis not present

## 2022-04-17 DIAGNOSIS — Z940 Kidney transplant status: Secondary | ICD-10-CM | POA: Diagnosis not present

## 2022-04-19 DIAGNOSIS — E8809 Other disorders of plasma-protein metabolism, not elsewhere classified: Secondary | ICD-10-CM | POA: Diagnosis not present

## 2022-04-19 DIAGNOSIS — D649 Anemia, unspecified: Secondary | ICD-10-CM | POA: Diagnosis not present

## 2022-04-19 DIAGNOSIS — N183 Chronic kidney disease, stage 3 unspecified: Secondary | ICD-10-CM | POA: Diagnosis not present

## 2022-04-25 DIAGNOSIS — F200 Paranoid schizophrenia: Secondary | ICD-10-CM | POA: Diagnosis not present

## 2022-05-04 DIAGNOSIS — F0390 Unspecified dementia without behavioral disturbance: Secondary | ICD-10-CM | POA: Diagnosis not present

## 2022-05-04 DIAGNOSIS — R634 Abnormal weight loss: Secondary | ICD-10-CM | POA: Diagnosis not present

## 2022-05-04 DIAGNOSIS — R110 Nausea: Secondary | ICD-10-CM | POA: Diagnosis not present

## 2022-05-08 DIAGNOSIS — N390 Urinary tract infection, site not specified: Secondary | ICD-10-CM | POA: Diagnosis not present

## 2022-05-08 DIAGNOSIS — N186 End stage renal disease: Secondary | ICD-10-CM | POA: Diagnosis not present

## 2022-05-08 DIAGNOSIS — D649 Anemia, unspecified: Secondary | ICD-10-CM | POA: Diagnosis not present

## 2022-05-08 DIAGNOSIS — Z940 Kidney transplant status: Secondary | ICD-10-CM | POA: Diagnosis not present

## 2022-05-10 DIAGNOSIS — Z7960 Long term (current) use of unspecified immunomodulators and immunosuppressants: Secondary | ICD-10-CM | POA: Diagnosis not present

## 2022-05-10 DIAGNOSIS — N189 Chronic kidney disease, unspecified: Secondary | ICD-10-CM | POA: Diagnosis not present

## 2022-05-10 DIAGNOSIS — E1129 Type 2 diabetes mellitus with other diabetic kidney complication: Secondary | ICD-10-CM | POA: Diagnosis not present

## 2022-05-10 DIAGNOSIS — I129 Hypertensive chronic kidney disease with stage 1 through stage 4 chronic kidney disease, or unspecified chronic kidney disease: Secondary | ICD-10-CM | POA: Diagnosis not present

## 2022-05-10 DIAGNOSIS — D631 Anemia in chronic kidney disease: Secondary | ICD-10-CM | POA: Diagnosis not present

## 2022-05-10 DIAGNOSIS — D72819 Decreased white blood cell count, unspecified: Secondary | ICD-10-CM | POA: Diagnosis not present

## 2022-05-10 DIAGNOSIS — Z940 Kidney transplant status: Secondary | ICD-10-CM | POA: Diagnosis not present

## 2022-05-10 DIAGNOSIS — N390 Urinary tract infection, site not specified: Secondary | ICD-10-CM | POA: Diagnosis not present

## 2022-05-10 DIAGNOSIS — F209 Schizophrenia, unspecified: Secondary | ICD-10-CM | POA: Diagnosis not present

## 2022-05-10 DIAGNOSIS — E559 Vitamin D deficiency, unspecified: Secondary | ICD-10-CM | POA: Diagnosis not present

## 2022-05-11 DIAGNOSIS — D649 Anemia, unspecified: Secondary | ICD-10-CM | POA: Diagnosis not present

## 2022-05-11 DIAGNOSIS — F200 Paranoid schizophrenia: Secondary | ICD-10-CM | POA: Diagnosis not present

## 2022-05-12 DIAGNOSIS — E0921 Drug or chemical induced diabetes mellitus with diabetic nephropathy: Secondary | ICD-10-CM | POA: Diagnosis not present

## 2022-05-12 DIAGNOSIS — N186 End stage renal disease: Secondary | ICD-10-CM | POA: Diagnosis not present

## 2022-05-16 DIAGNOSIS — D649 Anemia, unspecified: Secondary | ICD-10-CM | POA: Diagnosis not present

## 2022-05-19 DIAGNOSIS — D72829 Elevated white blood cell count, unspecified: Secondary | ICD-10-CM | POA: Diagnosis not present

## 2022-05-22 DIAGNOSIS — N390 Urinary tract infection, site not specified: Secondary | ICD-10-CM | POA: Diagnosis not present

## 2022-05-22 DIAGNOSIS — Z940 Kidney transplant status: Secondary | ICD-10-CM | POA: Diagnosis not present

## 2022-05-24 DIAGNOSIS — N186 End stage renal disease: Secondary | ICD-10-CM | POA: Diagnosis not present

## 2022-05-24 DIAGNOSIS — N1831 Chronic kidney disease, stage 3a: Secondary | ICD-10-CM | POA: Diagnosis not present

## 2022-05-24 DIAGNOSIS — D631 Anemia in chronic kidney disease: Secondary | ICD-10-CM | POA: Diagnosis not present

## 2022-05-24 DIAGNOSIS — D649 Anemia, unspecified: Secondary | ICD-10-CM | POA: Diagnosis not present

## 2022-05-24 DIAGNOSIS — E0921 Drug or chemical induced diabetes mellitus with diabetic nephropathy: Secondary | ICD-10-CM | POA: Diagnosis not present

## 2022-05-31 DIAGNOSIS — N186 End stage renal disease: Secondary | ICD-10-CM | POA: Diagnosis not present

## 2022-05-31 DIAGNOSIS — E0921 Drug or chemical induced diabetes mellitus with diabetic nephropathy: Secondary | ICD-10-CM | POA: Diagnosis not present

## 2022-05-31 DIAGNOSIS — D649 Anemia, unspecified: Secondary | ICD-10-CM | POA: Diagnosis not present

## 2022-06-13 DIAGNOSIS — R404 Transient alteration of awareness: Secondary | ICD-10-CM | POA: Diagnosis not present

## 2022-06-14 DIAGNOSIS — R404 Transient alteration of awareness: Secondary | ICD-10-CM | POA: Diagnosis not present

## 2022-06-14 DIAGNOSIS — N186 End stage renal disease: Secondary | ICD-10-CM | POA: Diagnosis not present

## 2022-06-14 DIAGNOSIS — D72829 Elevated white blood cell count, unspecified: Secondary | ICD-10-CM | POA: Diagnosis not present

## 2022-06-14 DIAGNOSIS — Z940 Kidney transplant status: Secondary | ICD-10-CM | POA: Diagnosis not present

## 2022-06-14 DIAGNOSIS — R109 Unspecified abdominal pain: Secondary | ICD-10-CM | POA: Diagnosis not present

## 2022-06-14 DIAGNOSIS — I517 Cardiomegaly: Secondary | ICD-10-CM | POA: Diagnosis not present

## 2022-06-15 DIAGNOSIS — Z940 Kidney transplant status: Secondary | ICD-10-CM | POA: Diagnosis not present

## 2022-06-15 DIAGNOSIS — M1380 Other specified arthritis, unspecified site: Secondary | ICD-10-CM | POA: Diagnosis not present

## 2022-06-15 DIAGNOSIS — D72829 Elevated white blood cell count, unspecified: Secondary | ICD-10-CM | POA: Diagnosis not present

## 2022-06-15 DIAGNOSIS — F411 Generalized anxiety disorder: Secondary | ICD-10-CM | POA: Diagnosis not present

## 2022-06-15 DIAGNOSIS — A09 Infectious gastroenteritis and colitis, unspecified: Secondary | ICD-10-CM | POA: Diagnosis not present

## 2022-06-15 DIAGNOSIS — W19XXXA Unspecified fall, initial encounter: Secondary | ICD-10-CM | POA: Diagnosis not present

## 2022-06-15 DIAGNOSIS — R404 Transient alteration of awareness: Secondary | ICD-10-CM | POA: Diagnosis not present

## 2022-06-15 DIAGNOSIS — H6123 Impacted cerumen, bilateral: Secondary | ICD-10-CM | POA: Diagnosis not present

## 2022-06-16 DIAGNOSIS — R404 Transient alteration of awareness: Secondary | ICD-10-CM | POA: Diagnosis not present

## 2022-06-16 DIAGNOSIS — D72829 Elevated white blood cell count, unspecified: Secondary | ICD-10-CM | POA: Diagnosis not present

## 2022-06-16 DIAGNOSIS — N179 Acute kidney failure, unspecified: Secondary | ICD-10-CM | POA: Diagnosis not present

## 2022-06-19 DIAGNOSIS — N186 End stage renal disease: Secondary | ICD-10-CM | POA: Diagnosis not present

## 2022-06-19 DIAGNOSIS — E0921 Drug or chemical induced diabetes mellitus with diabetic nephropathy: Secondary | ICD-10-CM | POA: Diagnosis not present

## 2022-06-19 DIAGNOSIS — Z940 Kidney transplant status: Secondary | ICD-10-CM | POA: Diagnosis not present

## 2022-06-19 DIAGNOSIS — N390 Urinary tract infection, site not specified: Secondary | ICD-10-CM | POA: Diagnosis not present

## 2022-06-23 DIAGNOSIS — D631 Anemia in chronic kidney disease: Secondary | ICD-10-CM | POA: Diagnosis not present

## 2022-06-23 DIAGNOSIS — N189 Chronic kidney disease, unspecified: Secondary | ICD-10-CM | POA: Diagnosis not present

## 2022-06-23 DIAGNOSIS — E559 Vitamin D deficiency, unspecified: Secondary | ICD-10-CM | POA: Diagnosis not present

## 2022-06-23 DIAGNOSIS — Z7960 Long term (current) use of unspecified immunomodulators and immunosuppressants: Secondary | ICD-10-CM | POA: Diagnosis not present

## 2022-06-23 DIAGNOSIS — F209 Schizophrenia, unspecified: Secondary | ICD-10-CM | POA: Diagnosis not present

## 2022-06-23 DIAGNOSIS — E1129 Type 2 diabetes mellitus with other diabetic kidney complication: Secondary | ICD-10-CM | POA: Diagnosis not present

## 2022-06-23 DIAGNOSIS — I129 Hypertensive chronic kidney disease with stage 1 through stage 4 chronic kidney disease, or unspecified chronic kidney disease: Secondary | ICD-10-CM | POA: Diagnosis not present

## 2022-06-23 DIAGNOSIS — Z940 Kidney transplant status: Secondary | ICD-10-CM | POA: Diagnosis not present

## 2022-06-27 DIAGNOSIS — F321 Major depressive disorder, single episode, moderate: Secondary | ICD-10-CM | POA: Diagnosis not present

## 2022-06-27 DIAGNOSIS — F0390 Unspecified dementia without behavioral disturbance: Secondary | ICD-10-CM | POA: Diagnosis not present

## 2022-06-27 DIAGNOSIS — F200 Paranoid schizophrenia: Secondary | ICD-10-CM | POA: Diagnosis not present

## 2022-06-27 DIAGNOSIS — F411 Generalized anxiety disorder: Secondary | ICD-10-CM | POA: Diagnosis not present

## 2022-06-29 DIAGNOSIS — I10 Essential (primary) hypertension: Secondary | ICD-10-CM | POA: Diagnosis not present

## 2022-06-29 DIAGNOSIS — K219 Gastro-esophageal reflux disease without esophagitis: Secondary | ICD-10-CM | POA: Diagnosis not present

## 2022-06-29 DIAGNOSIS — F200 Paranoid schizophrenia: Secondary | ICD-10-CM | POA: Diagnosis not present

## 2022-06-29 DIAGNOSIS — D649 Anemia, unspecified: Secondary | ICD-10-CM | POA: Diagnosis not present

## 2022-06-30 DIAGNOSIS — I10 Essential (primary) hypertension: Secondary | ICD-10-CM | POA: Diagnosis not present

## 2022-07-03 DIAGNOSIS — D124 Benign neoplasm of descending colon: Secondary | ICD-10-CM | POA: Diagnosis not present

## 2022-07-03 DIAGNOSIS — D509 Iron deficiency anemia, unspecified: Secondary | ICD-10-CM | POA: Diagnosis not present

## 2022-07-03 DIAGNOSIS — K2940 Chronic atrophic gastritis without bleeding: Secondary | ICD-10-CM | POA: Diagnosis not present

## 2022-07-03 DIAGNOSIS — K31A19 Gastric intestinal metaplasia without dysplasia, unspecified site: Secondary | ICD-10-CM | POA: Diagnosis not present

## 2022-07-03 DIAGNOSIS — K2930 Chronic superficial gastritis without bleeding: Secondary | ICD-10-CM | POA: Diagnosis not present

## 2022-07-03 DIAGNOSIS — K2289 Other specified disease of esophagus: Secondary | ICD-10-CM | POA: Diagnosis not present

## 2022-07-05 DIAGNOSIS — D649 Anemia, unspecified: Secondary | ICD-10-CM | POA: Diagnosis not present

## 2022-07-10 DIAGNOSIS — K2930 Chronic superficial gastritis without bleeding: Secondary | ICD-10-CM | POA: Diagnosis not present

## 2022-07-10 DIAGNOSIS — K31A19 Gastric intestinal metaplasia without dysplasia, unspecified site: Secondary | ICD-10-CM | POA: Diagnosis not present

## 2022-07-10 DIAGNOSIS — D124 Benign neoplasm of descending colon: Secondary | ICD-10-CM | POA: Diagnosis not present

## 2022-07-10 DIAGNOSIS — K2940 Chronic atrophic gastritis without bleeding: Secondary | ICD-10-CM | POA: Diagnosis not present

## 2022-07-11 DIAGNOSIS — F200 Paranoid schizophrenia: Secondary | ICD-10-CM | POA: Diagnosis not present

## 2022-07-25 DIAGNOSIS — F200 Paranoid schizophrenia: Secondary | ICD-10-CM | POA: Diagnosis not present

## 2022-07-28 DIAGNOSIS — F411 Generalized anxiety disorder: Secondary | ICD-10-CM | POA: Diagnosis not present

## 2022-07-28 DIAGNOSIS — F200 Paranoid schizophrenia: Secondary | ICD-10-CM | POA: Diagnosis not present

## 2022-07-28 DIAGNOSIS — F321 Major depressive disorder, single episode, moderate: Secondary | ICD-10-CM | POA: Diagnosis not present

## 2022-07-28 DIAGNOSIS — F0390 Unspecified dementia without behavioral disturbance: Secondary | ICD-10-CM | POA: Diagnosis not present

## 2022-08-10 DIAGNOSIS — Z7960 Long term (current) use of unspecified immunomodulators and immunosuppressants: Secondary | ICD-10-CM | POA: Diagnosis not present

## 2022-08-10 DIAGNOSIS — E559 Vitamin D deficiency, unspecified: Secondary | ICD-10-CM | POA: Diagnosis not present

## 2022-08-10 DIAGNOSIS — I129 Hypertensive chronic kidney disease with stage 1 through stage 4 chronic kidney disease, or unspecified chronic kidney disease: Secondary | ICD-10-CM | POA: Diagnosis not present

## 2022-08-10 DIAGNOSIS — Z940 Kidney transplant status: Secondary | ICD-10-CM | POA: Diagnosis not present

## 2022-08-10 DIAGNOSIS — F209 Schizophrenia, unspecified: Secondary | ICD-10-CM | POA: Diagnosis not present

## 2022-08-10 DIAGNOSIS — N390 Urinary tract infection, site not specified: Secondary | ICD-10-CM | POA: Diagnosis not present

## 2022-08-10 DIAGNOSIS — D631 Anemia in chronic kidney disease: Secondary | ICD-10-CM | POA: Diagnosis not present

## 2022-08-10 DIAGNOSIS — D72819 Decreased white blood cell count, unspecified: Secondary | ICD-10-CM | POA: Diagnosis not present

## 2022-08-10 DIAGNOSIS — E1129 Type 2 diabetes mellitus with other diabetic kidney complication: Secondary | ICD-10-CM | POA: Diagnosis not present

## 2022-08-10 DIAGNOSIS — N189 Chronic kidney disease, unspecified: Secondary | ICD-10-CM | POA: Diagnosis not present

## 2022-08-11 ENCOUNTER — Other Ambulatory Visit (HOSPITAL_COMMUNITY): Payer: Self-pay | Admitting: Nephrology

## 2022-08-11 DIAGNOSIS — D649 Anemia, unspecified: Secondary | ICD-10-CM | POA: Diagnosis not present

## 2022-08-11 DIAGNOSIS — Z940 Kidney transplant status: Secondary | ICD-10-CM | POA: Diagnosis not present

## 2022-08-15 DIAGNOSIS — N390 Urinary tract infection, site not specified: Secondary | ICD-10-CM | POA: Diagnosis not present

## 2022-08-17 DIAGNOSIS — F200 Paranoid schizophrenia: Secondary | ICD-10-CM | POA: Diagnosis not present

## 2022-08-18 ENCOUNTER — Other Ambulatory Visit (HOSPITAL_COMMUNITY): Payer: Self-pay | Admitting: *Deleted

## 2022-08-18 DIAGNOSIS — K31A0 Gastric intestinal metaplasia, unspecified: Secondary | ICD-10-CM | POA: Diagnosis not present

## 2022-08-18 DIAGNOSIS — N189 Chronic kidney disease, unspecified: Secondary | ICD-10-CM | POA: Diagnosis not present

## 2022-08-18 DIAGNOSIS — D509 Iron deficiency anemia, unspecified: Secondary | ICD-10-CM | POA: Diagnosis not present

## 2022-08-18 DIAGNOSIS — D126 Benign neoplasm of colon, unspecified: Secondary | ICD-10-CM | POA: Diagnosis not present

## 2022-08-21 ENCOUNTER — Inpatient Hospital Stay (HOSPITAL_COMMUNITY): Admission: RE | Admit: 2022-08-21 | Payer: Medicare PPO | Source: Ambulatory Visit

## 2022-08-24 ENCOUNTER — Telehealth: Payer: Self-pay | Admitting: Hematology and Oncology

## 2022-08-24 NOTE — Telephone Encounter (Signed)
Confirmed new patient appointment 1/8

## 2022-08-28 ENCOUNTER — Encounter (HOSPITAL_COMMUNITY): Payer: Medicare PPO

## 2022-08-28 DIAGNOSIS — F321 Major depressive disorder, single episode, moderate: Secondary | ICD-10-CM | POA: Diagnosis not present

## 2022-08-28 DIAGNOSIS — F0390 Unspecified dementia without behavioral disturbance: Secondary | ICD-10-CM | POA: Diagnosis not present

## 2022-08-28 DIAGNOSIS — F411 Generalized anxiety disorder: Secondary | ICD-10-CM | POA: Diagnosis not present

## 2022-08-28 DIAGNOSIS — F200 Paranoid schizophrenia: Secondary | ICD-10-CM | POA: Diagnosis not present

## 2022-08-29 DIAGNOSIS — D509 Iron deficiency anemia, unspecified: Secondary | ICD-10-CM | POA: Diagnosis not present

## 2022-09-18 DIAGNOSIS — Z940 Kidney transplant status: Secondary | ICD-10-CM | POA: Diagnosis not present

## 2022-09-18 DIAGNOSIS — D631 Anemia in chronic kidney disease: Secondary | ICD-10-CM | POA: Diagnosis not present

## 2022-09-18 DIAGNOSIS — I129 Hypertensive chronic kidney disease with stage 1 through stage 4 chronic kidney disease, or unspecified chronic kidney disease: Secondary | ICD-10-CM | POA: Diagnosis not present

## 2022-09-18 DIAGNOSIS — N189 Chronic kidney disease, unspecified: Secondary | ICD-10-CM | POA: Diagnosis not present

## 2022-09-18 DIAGNOSIS — E1129 Type 2 diabetes mellitus with other diabetic kidney complication: Secondary | ICD-10-CM | POA: Diagnosis not present

## 2022-09-19 ENCOUNTER — Telehealth: Payer: Self-pay

## 2022-09-19 NOTE — Telephone Encounter (Signed)
Patient is a resident of [facility] in [place]. Spoke with patient's nurse to confirm transportation to apt with Dr. Lorenso Courier on 09/20/22. Patient's daughter will not be able to be at appointment but is requesting phone update after appointment.

## 2022-09-20 ENCOUNTER — Other Ambulatory Visit: Payer: Self-pay

## 2022-09-20 ENCOUNTER — Inpatient Hospital Stay: Payer: Medicare PPO | Attending: Hematology and Oncology | Admitting: Hematology and Oncology

## 2022-09-20 ENCOUNTER — Inpatient Hospital Stay: Payer: Medicare PPO

## 2022-09-20 VITALS — BP 134/63 | HR 78 | Temp 98.4°F | Resp 16 | Ht 63.0 in | Wt 135.5 lb

## 2022-09-20 DIAGNOSIS — E1122 Type 2 diabetes mellitus with diabetic chronic kidney disease: Secondary | ICD-10-CM | POA: Insufficient documentation

## 2022-09-20 DIAGNOSIS — D631 Anemia in chronic kidney disease: Secondary | ICD-10-CM | POA: Diagnosis not present

## 2022-09-20 DIAGNOSIS — I129 Hypertensive chronic kidney disease with stage 1 through stage 4 chronic kidney disease, or unspecified chronic kidney disease: Secondary | ICD-10-CM | POA: Diagnosis not present

## 2022-09-20 DIAGNOSIS — N189 Chronic kidney disease, unspecified: Secondary | ICD-10-CM | POA: Insufficient documentation

## 2022-09-20 DIAGNOSIS — Z940 Kidney transplant status: Secondary | ICD-10-CM | POA: Insufficient documentation

## 2022-09-20 DIAGNOSIS — F209 Schizophrenia, unspecified: Secondary | ICD-10-CM | POA: Insufficient documentation

## 2022-09-20 DIAGNOSIS — D649 Anemia, unspecified: Secondary | ICD-10-CM

## 2022-09-20 LAB — CBC WITH DIFFERENTIAL (CANCER CENTER ONLY)
Abs Immature Granulocytes: 0.06 10*3/uL (ref 0.00–0.07)
Basophils Absolute: 0 10*3/uL (ref 0.0–0.1)
Basophils Relative: 0 %
Eosinophils Absolute: 0.1 10*3/uL (ref 0.0–0.5)
Eosinophils Relative: 1 %
HCT: 29.3 % — ABNORMAL LOW (ref 36.0–46.0)
Hemoglobin: 9.8 g/dL — ABNORMAL LOW (ref 12.0–15.0)
Immature Granulocytes: 1 %
Lymphocytes Relative: 17 %
Lymphs Abs: 1.5 10*3/uL (ref 0.7–4.0)
MCH: 31.7 pg (ref 26.0–34.0)
MCHC: 33.4 g/dL (ref 30.0–36.0)
MCV: 94.8 fL (ref 80.0–100.0)
Monocytes Absolute: 0.4 10*3/uL (ref 0.1–1.0)
Monocytes Relative: 4 %
Neutro Abs: 6.8 10*3/uL (ref 1.7–7.7)
Neutrophils Relative %: 77 %
Platelet Count: 255 10*3/uL (ref 150–400)
RBC: 3.09 MIL/uL — ABNORMAL LOW (ref 3.87–5.11)
RDW: 14.6 % (ref 11.5–15.5)
WBC Count: 8.9 10*3/uL (ref 4.0–10.5)
nRBC: 0 % (ref 0.0–0.2)

## 2022-09-20 LAB — RETIC PANEL
Immature Retic Fract: 13.2 % (ref 2.3–15.9)
RBC.: 3.12 MIL/uL — ABNORMAL LOW (ref 3.87–5.11)
Retic Count, Absolute: 81.7 10*3/uL (ref 19.0–186.0)
Retic Ct Pct: 2.6 % (ref 0.4–3.1)
Reticulocyte Hemoglobin: 28.9 pg (ref >27.9)

## 2022-09-20 LAB — IRON AND IRON BINDING CAPACITY (CC-WL,HP ONLY)
Iron: 35 ug/dL (ref 28–170)
Saturation Ratios: 16 % (ref 10.4–31.8)
TIBC: 223 ug/dL — ABNORMAL LOW (ref 250–450)
UIBC: 188 ug/dL

## 2022-09-20 LAB — CMP (CANCER CENTER ONLY)
ALT: 20 U/L (ref 0–44)
AST: 18 U/L (ref 15–41)
Albumin: 3.9 g/dL (ref 3.5–5.0)
Alkaline Phosphatase: 86 U/L (ref 38–126)
Anion gap: 9 (ref 5–15)
BUN: 28 mg/dL — ABNORMAL HIGH (ref 8–23)
CO2: 21 mmol/L — ABNORMAL LOW (ref 22–32)
Calcium: 9.5 mg/dL (ref 8.9–10.3)
Chloride: 110 mmol/L (ref 98–111)
Creatinine: 1.11 mg/dL — ABNORMAL HIGH (ref 0.44–1.00)
GFR, Estimated: 52 mL/min — ABNORMAL LOW (ref >60)
Glucose, Bld: 216 mg/dL — ABNORMAL HIGH (ref 70–99)
Potassium: 3.6 mmol/L (ref 3.5–5.1)
Sodium: 140 mmol/L (ref 135–145)
Total Bilirubin: 0.1 mg/dL — ABNORMAL LOW (ref 0.3–1.2)
Total Protein: 7.5 g/dL (ref 6.5–8.1)

## 2022-09-20 LAB — LACTATE DEHYDROGENASE: LDH: 129 U/L (ref 98–192)

## 2022-09-20 LAB — FOLATE: Folate: >40 ng/mL (ref >5.9)

## 2022-09-20 LAB — VITAMIN B12: Vitamin B-12: 485 pg/mL (ref 180–914)

## 2022-09-20 LAB — FERRITIN: Ferritin: 1255 ng/mL — ABNORMAL HIGH (ref 11–307)

## 2022-09-20 NOTE — Progress Notes (Signed)
[facility] sent paperwork with current medications but the copy they sent did have not complete information do to copy errors. Provided fax number for [employer] representative to fax legible copy. Unable to update patient's med list at this appointment.

## 2022-09-20 NOTE — Progress Notes (Signed)
[facility] [phone]   [phone]  INITIAL CONSULT NOTE  Patient Care Team: Patient, No Pcp Per as PCP - General (General Practice)  Hematological/Oncological History # Anemia in Setting of CKD 08/10/2022: WBC 9.7, Hgb 9.5, MCV 91, Plt 270 09/20/2022: establish care with Dr. Lorenso Courier   CHIEF COMPLAINTS/PURPOSE OF CONSULTATION:  "Anemia in Setting of CKD "  HISTORY OF PRESENTING ILLNESS:  Kristina Hughes 72 y.o. female with medical history significant for HTN, kidney transplant in 2013, schizophrenia, and DM type II who presents for evaluation of anemia in setting of CKD.   On review of the previous records [name] was last seen by nephrology on 08/10/2022. At that time she was found to have WBC 9.7, Hgb 9.5, MCV 91, Plt 270.  Due to concern for these findings the patient was referred to hematology for further evaluation and management.  On exam today [name] reports that she has had anemia "for a long time".  She notes that she is not currently taking any iron pills.  She notes that her energy levels are good and she is able to do everything she needs to do at home.  She reports she is not having any issues with lightheadedness, dizziness, shortness of breath.  She notes do not have any overt signs of bleeding, bruising, or dark stools.  She notes that she eats a regular diet but prefers chicken and fish and does not eat much in the way of red meat.  She does eat some salad and cabbage.  She denies any nausea, vomiting, or diarrhea.  On further discussion she reports that her mother had type 2 diabetes and her father had a heart attack.  She has 4 brothers all of whom have type 2 diabetes.  She had a brother with cancer but she is unsure what kind.  She has 2 healthy children.  She is a never smoker never drinker and previously worked at a nursing home.  A full 10 point ROS was otherwise negative.  MEDICAL HISTORY:  Past Medical History:  Diagnosis Date    Hypertension    Renal disorder    Kidney transplant 2013    SURGICAL HISTORY: No past surgical history on file.  SOCIAL HISTORY: Social History   Socioeconomic History   Marital status: Widowed    Spouse name: Not on file   Number of children: Not on file   Years of education: Not on file   Highest education level: Not on file  Occupational History   Not on file  Tobacco Use   Smoking status: Never   Smokeless tobacco: Never  Substance and Sexual Activity   Alcohol use: Never   Drug use: Never   Sexual activity: Not on file  Other Topics Concern   Not on file  Social History Narrative   Not on file   Social Determinants of Health   Financial Resource Strain: Not on file  Food Insecurity: Not on file  Transportation Needs: Not on file  Physical Activity: Not on file  Stress: Not on file  Social Connections: Not on file  Intimate Partner Violence: Not on file    FAMILY HISTORY: No family history on file.  ALLERGIES:  is allergic to aspirin and penicillins.  MEDICATIONS:  No current outpatient medications on file.   No current facility-administered medications for this visit.    REVIEW OF SYSTEMS:   Constitutional: ( - ) fevers, ( - )  chills , ( - )  night sweats Eyes: ( - ) blurriness of vision, ( - ) double vision, ( - ) watery eyes Ears, nose, mouth, throat, and face: ( - ) mucositis, ( - ) sore throat Respiratory: ( - ) cough, ( - ) dyspnea, ( - ) wheezes Cardiovascular: ( - ) palpitation, ( - ) chest discomfort, ( - ) lower extremity swelling Gastrointestinal:  ( - ) nausea, ( - ) heartburn, ( - ) change in bowel habits Skin: ( - ) abnormal skin rashes Lymphatics: ( - ) new lymphadenopathy, ( - ) easy bruising Neurological: ( - ) numbness, ( - ) tingling, ( - ) new weaknesses Behavioral/Psych: ( - ) mood change, ( - ) new changes  All other systems were reviewed with the patient and are negative.  PHYSICAL EXAMINATION:  Vitals:   09/20/22 1304   BP: 134/63  Pulse: 78  Resp: 16  Temp: 98.4 F (36.9 C)  SpO2: 100%   Filed Weights   09/20/22 1304  Weight: 135 lb 8 oz (61.5 kg)    GENERAL: well appearing elderly female in NAD  SKIN: skin color, texture, turgor are normal, no rashes or significant lesions EYES: conjunctiva are pink and non-injected, sclera clear LUNGS: clear to auscultation and percussion with normal breathing effort HEART: regular rate & rhythm and no murmurs and no lower extremity edema Musculoskeletal: no cyanosis of digits and no clubbing  PSYCH: alert & oriented x 3, fluent speech NEURO: no focal motor/sensory deficits  LABORATORY DATA:  I have reviewed the data as listed    Latest Ref Rng & Units 09/20/2022    1:44 PM  CBC  WBC 4.0 - 10.5 K/uL 8.9   Hemoglobin 12.0 - 15.0 g/dL 9.8   Hematocrit 69.4 - 46.0 % 29.3   Platelets 150 - 400 K/uL 255        Latest Ref Rng & Units 09/20/2022    1:44 PM  CMP  Glucose 70 - 99 mg/dL 854   BUN 8 - 23 mg/dL 28   Creatinine 6.27 - 1.00 mg/dL 0.35   Sodium 009 - 381 mmol/L 140   Potassium 3.5 - 5.1 mmol/L 3.6   Chloride 98 - 111 mmol/L 110   CO2 22 - 32 mmol/L 21   Calcium 8.9 - 10.3 mg/dL 9.5   Total Protein 6.5 - 8.1 g/dL 7.5   Total Bilirubin 0.3 - 1.2 mg/dL 0.1   Alkaline Phos 38 - 126 U/L 86   AST 15 - 41 U/L 18   ALT 0 - 44 U/L 20      ASSESSMENT & PLAN [name] 72 y.o. female with medical history significant for HTN, kidney transplant in 2013, schizophrenia, and DM type II who presents for evaluation of anemia in setting of CKD.   After review of the labs, review of the records, and discussion with the patient the patients findings are most consistent with anemia in the setting of CKD.  # Anemia in Setting of CKD # Normocytic Anemia -- Today we will order full nutritional workup to include vitamin B12, folate, iron panel, and ferritin. -- Will order multiple myeloma workup with SPEP and serum free light chains. -- Will also check  erythropoietin levels as well as LDH and reticulocyte panel. -- Return to clinic pending the results of the above studies.  Orders Placed This Encounter  Procedures   CBC with Differential (Cancer Center Only)    Standing Status:   Future    Number of Occurrences:  1    Standing Expiration Date:   09/21/2023   CMP (Cancer Center only)    Standing Status:   Future    Number of Occurrences:   1    Standing Expiration Date:   09/21/2023   Ferritin    Standing Status:   Future    Number of Occurrences:   1    Standing Expiration Date:   09/21/2023   Iron and Iron Binding Capacity ([facility] only)    Standing Status:   Future    Number of Occurrences:   1    Standing Expiration Date:   09/21/2023   Retic Panel    Standing Status:   Future    Number of Occurrences:   1    Standing Expiration Date:   09/21/2023   Lactate dehydrogenase (LDH)    Standing Status:   Future    Number of Occurrences:   1    Standing Expiration Date:   09/20/2023   Multiple Myeloma Panel (SPEP&IFE w/QIG)    Standing Status:   Future    Number of Occurrences:   1    Standing Expiration Date:   09/20/2023   Kappa/lambda light chains    Standing Status:   Future    Number of Occurrences:   1    Standing Expiration Date:   09/20/2023   Vitamin B12    Standing Status:   Future    Number of Occurrences:   1    Standing Expiration Date:   09/20/2023   Methylmalonic acid, serum    Standing Status:   Future    Number of Occurrences:   1    Standing Expiration Date:   09/20/2023   Folate, Serum    Standing Status:   Future    Number of Occurrences:   1    Standing Expiration Date:   09/20/2023   Erythropoietin    Standing Status:   Future    Number of Occurrences:   1    Standing Expiration Date:   09/20/2023    All questions were answered. The patient knows to call the clinic with any problems, questions or concerns.  A total of more than 60 minutes were spent on this encounter with face-to-face time and  non-face-to-face time, including preparing to see the patient, ordering tests and/or medications, counseling the patient and coordination of care as outlined above.   Ledell Peoples, MD Department of Hematology/Oncology [place] [facility] Phone: [phone] Pager: [phone] Email: [email or web address]  10/01/2022 6:14 PM

## 2022-09-21 DIAGNOSIS — F200 Paranoid schizophrenia: Secondary | ICD-10-CM | POA: Diagnosis not present

## 2022-09-21 LAB — ERYTHROPOIETIN: Erythropoietin: 20.3 m[IU]/mL — ABNORMAL HIGH (ref 2.6–18.5)

## 2022-09-21 LAB — KAPPA/LAMBDA LIGHT CHAINS
Kappa free light chain: 35.1 mg/L — ABNORMAL HIGH (ref 3.3–19.4)
Kappa, lambda light chain ratio: 2.22 — ABNORMAL HIGH (ref 0.26–1.65)
Lambda free light chains: 15.8 mg/L (ref 5.7–26.3)

## 2022-09-25 LAB — MULTIPLE MYELOMA PANEL, SERUM
Albumin SerPl Elph-Mcnc: 3.5 g/dL (ref 2.9–4.4)
Albumin/Glob SerPl: 1.2 (ref 0.7–1.7)
Alpha 1: 0.4 g/dL (ref 0.0–0.4)
Alpha2 Glob SerPl Elph-Mcnc: 0.9 g/dL (ref 0.4–1.0)
B-Globulin SerPl Elph-Mcnc: 1.1 g/dL (ref 0.7–1.3)
Gamma Glob SerPl Elph-Mcnc: 0.8 g/dL (ref 0.4–1.8)
Globulin, Total: 3.1 g/dL (ref 2.2–3.9)
IgA: 198 mg/dL (ref 64–422)
IgG (Immunoglobin G), Serum: 861 mg/dL (ref 586–1602)
IgM (Immunoglobulin M), Srm: 60 mg/dL (ref 26–217)
Total Protein ELP: 6.6 g/dL (ref 6.0–8.5)

## 2022-09-25 LAB — METHYLMALONIC ACID, SERUM: Methylmalonic Acid, Quantitative: 176 nmol/L (ref 0–378)

## 2022-09-26 DIAGNOSIS — F411 Generalized anxiety disorder: Secondary | ICD-10-CM | POA: Diagnosis not present

## 2022-09-26 DIAGNOSIS — F0390 Unspecified dementia without behavioral disturbance: Secondary | ICD-10-CM | POA: Diagnosis not present

## 2022-09-26 DIAGNOSIS — F200 Paranoid schizophrenia: Secondary | ICD-10-CM | POA: Diagnosis not present

## 2022-09-26 DIAGNOSIS — F321 Major depressive disorder, single episode, moderate: Secondary | ICD-10-CM | POA: Diagnosis not present

## 2022-10-09 ENCOUNTER — Telehealth: Payer: Self-pay

## 2022-10-09 NOTE — Telephone Encounter (Addendum)
  Spoke with daughter to relay the following message from Dr. Lorenso Courier. Also spoke with patient's nurse at facility. Patient does have nephrologist per daughter.  ----- Message from Orson Slick, MD sent at 10/08/2022 11:58 AM EST ----- Please let Mrs. Poynter know that her labs are consistent with anemia 2/2 to chronic kidney disease. Please assure she has a nephrologist and recommend that the anemia be monitored with her kidney doctor. There is no need for routine f/u in our clinic.   ----- Message ----- From: Buel Ream, [facility] [place] Sent: 09/20/2022   2:33 PM EST To: Orson Slick, MD

## 2022-10-16 DIAGNOSIS — K5520 Angiodysplasia of colon without hemorrhage: Secondary | ICD-10-CM | POA: Diagnosis not present

## 2022-10-16 DIAGNOSIS — K219 Gastro-esophageal reflux disease without esophagitis: Secondary | ICD-10-CM | POA: Diagnosis not present

## 2022-10-16 DIAGNOSIS — D509 Iron deficiency anemia, unspecified: Secondary | ICD-10-CM | POA: Diagnosis not present

## 2022-10-17 DIAGNOSIS — D631 Anemia in chronic kidney disease: Secondary | ICD-10-CM | POA: Diagnosis not present

## 2022-10-17 DIAGNOSIS — N1832 Chronic kidney disease, stage 3b: Secondary | ICD-10-CM | POA: Diagnosis not present

## 2022-10-24 ENCOUNTER — Ambulatory Visit (HOSPITAL_COMMUNITY)
Admission: RE | Admit: 2022-10-24 | Discharge: 2022-10-24 | Disposition: A | Discharge status: Home / Self Care | Payer: Medicare PPO | Source: Ambulatory Visit | Attending: Nephrology | Admitting: Nephrology

## 2022-10-24 DIAGNOSIS — Z940 Kidney transplant status: Secondary | ICD-10-CM | POA: Diagnosis not present

## 2022-10-26 DIAGNOSIS — F411 Generalized anxiety disorder: Secondary | ICD-10-CM | POA: Diagnosis not present

## 2022-10-26 DIAGNOSIS — F0390 Unspecified dementia without behavioral disturbance: Secondary | ICD-10-CM | POA: Diagnosis not present

## 2022-10-26 DIAGNOSIS — F321 Major depressive disorder, single episode, moderate: Secondary | ICD-10-CM | POA: Diagnosis not present

## 2022-10-26 DIAGNOSIS — F200 Paranoid schizophrenia: Secondary | ICD-10-CM | POA: Diagnosis not present

## 2022-10-30 DIAGNOSIS — N186 End stage renal disease: Secondary | ICD-10-CM | POA: Diagnosis not present

## 2022-10-30 DIAGNOSIS — E0921 Drug or chemical induced diabetes mellitus with diabetic nephropathy: Secondary | ICD-10-CM | POA: Diagnosis not present

## 2022-10-30 DIAGNOSIS — N390 Urinary tract infection, site not specified: Secondary | ICD-10-CM | POA: Diagnosis not present

## 2022-10-30 DIAGNOSIS — Z940 Kidney transplant status: Secondary | ICD-10-CM | POA: Diagnosis not present

## 2022-10-31 DIAGNOSIS — D649 Anemia, unspecified: Secondary | ICD-10-CM | POA: Diagnosis not present

## 2022-10-31 DIAGNOSIS — N189 Chronic kidney disease, unspecified: Secondary | ICD-10-CM | POA: Diagnosis not present

## 2022-10-31 DIAGNOSIS — Z940 Kidney transplant status: Secondary | ICD-10-CM | POA: Diagnosis not present

## 2022-11-10 DIAGNOSIS — Z940 Kidney transplant status: Secondary | ICD-10-CM | POA: Diagnosis not present

## 2022-11-10 DIAGNOSIS — F209 Schizophrenia, unspecified: Secondary | ICD-10-CM | POA: Diagnosis not present

## 2022-11-10 DIAGNOSIS — N390 Urinary tract infection, site not specified: Secondary | ICD-10-CM | POA: Diagnosis not present

## 2022-11-10 DIAGNOSIS — N189 Chronic kidney disease, unspecified: Secondary | ICD-10-CM | POA: Diagnosis not present

## 2022-11-10 DIAGNOSIS — E559 Vitamin D deficiency, unspecified: Secondary | ICD-10-CM | POA: Diagnosis not present

## 2022-11-10 DIAGNOSIS — Z7960 Long term (current) use of unspecified immunomodulators and immunosuppressants: Secondary | ICD-10-CM | POA: Diagnosis not present

## 2022-11-10 DIAGNOSIS — E1129 Type 2 diabetes mellitus with other diabetic kidney complication: Secondary | ICD-10-CM | POA: Diagnosis not present

## 2022-11-10 DIAGNOSIS — D631 Anemia in chronic kidney disease: Secondary | ICD-10-CM | POA: Diagnosis not present

## 2022-11-10 DIAGNOSIS — D72819 Decreased white blood cell count, unspecified: Secondary | ICD-10-CM | POA: Diagnosis not present

## 2022-11-10 DIAGNOSIS — I129 Hypertensive chronic kidney disease with stage 1 through stage 4 chronic kidney disease, or unspecified chronic kidney disease: Secondary | ICD-10-CM | POA: Diagnosis not present

## 2022-11-22 DIAGNOSIS — F419 Anxiety disorder, unspecified: Secondary | ICD-10-CM | POA: Diagnosis not present

## 2022-11-22 DIAGNOSIS — M1990 Unspecified osteoarthritis, unspecified site: Secondary | ICD-10-CM | POA: Diagnosis not present

## 2022-11-22 DIAGNOSIS — F200 Paranoid schizophrenia: Secondary | ICD-10-CM | POA: Diagnosis not present

## 2022-11-22 DIAGNOSIS — I10 Essential (primary) hypertension: Secondary | ICD-10-CM | POA: Diagnosis not present

## 2022-11-23 ENCOUNTER — Other Ambulatory Visit (HOSPITAL_COMMUNITY): Payer: Self-pay

## 2022-11-24 ENCOUNTER — Ambulatory Visit (HOSPITAL_COMMUNITY)
Admission: RE | Admit: 2022-11-24 | Discharge: 2022-11-24 | Disposition: A | Discharge status: Home / Self Care | Payer: Medicare PPO | Source: Ambulatory Visit | Attending: Nephrology | Admitting: Nephrology

## 2022-11-24 DIAGNOSIS — D631 Anemia in chronic kidney disease: Secondary | ICD-10-CM | POA: Insufficient documentation

## 2022-11-24 DIAGNOSIS — N189 Chronic kidney disease, unspecified: Secondary | ICD-10-CM | POA: Insufficient documentation

## 2022-11-24 MED ORDER — SODIUM CHLORIDE 0.9 % IV SOLN
510.0000 mg | Freq: Once | INTRAVENOUS | Status: AC
  Administered 2022-11-24: 510 mg via INTRAVENOUS
  Filled 2022-11-24: qty 510

## 2022-11-29 DIAGNOSIS — N186 End stage renal disease: Secondary | ICD-10-CM | POA: Diagnosis not present

## 2022-12-18 DIAGNOSIS — F200 Paranoid schizophrenia: Secondary | ICD-10-CM | POA: Diagnosis not present

## 2022-12-18 DIAGNOSIS — F321 Major depressive disorder, single episode, moderate: Secondary | ICD-10-CM | POA: Diagnosis not present

## 2022-12-18 DIAGNOSIS — F411 Generalized anxiety disorder: Secondary | ICD-10-CM | POA: Diagnosis not present

## 2022-12-18 DIAGNOSIS — F0390 Unspecified dementia without behavioral disturbance: Secondary | ICD-10-CM | POA: Diagnosis not present

## 2022-12-27 DIAGNOSIS — Z940 Kidney transplant status: Secondary | ICD-10-CM | POA: Diagnosis not present

## 2022-12-27 DIAGNOSIS — N186 End stage renal disease: Secondary | ICD-10-CM | POA: Diagnosis not present

## 2022-12-27 DIAGNOSIS — N390 Urinary tract infection, site not specified: Secondary | ICD-10-CM | POA: Diagnosis not present

## 2022-12-27 DIAGNOSIS — E0921 Drug or chemical induced diabetes mellitus with diabetic nephropathy: Secondary | ICD-10-CM | POA: Diagnosis not present

## 2023-01-24 DIAGNOSIS — F200 Paranoid schizophrenia: Secondary | ICD-10-CM | POA: Diagnosis not present

## 2023-01-24 DIAGNOSIS — I10 Essential (primary) hypertension: Secondary | ICD-10-CM | POA: Diagnosis not present

## 2023-01-24 DIAGNOSIS — K219 Gastro-esophageal reflux disease without esophagitis: Secondary | ICD-10-CM | POA: Diagnosis not present

## 2023-01-24 DIAGNOSIS — F03918 Unspecified dementia, unspecified severity, with other behavioral disturbance: Secondary | ICD-10-CM | POA: Diagnosis not present

## 2023-02-14 DIAGNOSIS — F321 Major depressive disorder, single episode, moderate: Secondary | ICD-10-CM | POA: Diagnosis not present

## 2023-02-14 DIAGNOSIS — F411 Generalized anxiety disorder: Secondary | ICD-10-CM | POA: Diagnosis not present

## 2023-02-14 DIAGNOSIS — I10 Essential (primary) hypertension: Secondary | ICD-10-CM | POA: Diagnosis not present

## 2023-02-14 DIAGNOSIS — F200 Paranoid schizophrenia: Secondary | ICD-10-CM | POA: Diagnosis not present

## 2023-02-14 DIAGNOSIS — F01A0 Vascular dementia, mild, without behavioral disturbance, psychotic disturbance, mood disturbance, and anxiety: Secondary | ICD-10-CM | POA: Diagnosis not present

## 2023-02-22 DIAGNOSIS — F200 Paranoid schizophrenia: Secondary | ICD-10-CM | POA: Diagnosis not present

## 2023-02-22 DIAGNOSIS — F411 Generalized anxiety disorder: Secondary | ICD-10-CM | POA: Diagnosis not present

## 2023-02-22 DIAGNOSIS — F32A Depression, unspecified: Secondary | ICD-10-CM | POA: Diagnosis not present

## 2023-02-22 DIAGNOSIS — F0390 Unspecified dementia without behavioral disturbance: Secondary | ICD-10-CM | POA: Diagnosis not present

## 2023-02-23 DIAGNOSIS — F209 Schizophrenia, unspecified: Secondary | ICD-10-CM | POA: Diagnosis not present

## 2023-02-23 DIAGNOSIS — D72819 Decreased white blood cell count, unspecified: Secondary | ICD-10-CM | POA: Diagnosis not present

## 2023-02-23 DIAGNOSIS — Z940 Kidney transplant status: Secondary | ICD-10-CM | POA: Diagnosis not present

## 2023-02-23 DIAGNOSIS — I129 Hypertensive chronic kidney disease with stage 1 through stage 4 chronic kidney disease, or unspecified chronic kidney disease: Secondary | ICD-10-CM | POA: Diagnosis not present

## 2023-02-23 DIAGNOSIS — D631 Anemia in chronic kidney disease: Secondary | ICD-10-CM | POA: Diagnosis not present

## 2023-02-23 DIAGNOSIS — N390 Urinary tract infection, site not specified: Secondary | ICD-10-CM | POA: Diagnosis not present

## 2023-02-23 DIAGNOSIS — Z7960 Long term (current) use of unspecified immunomodulators and immunosuppressants: Secondary | ICD-10-CM | POA: Diagnosis not present

## 2023-02-23 DIAGNOSIS — E559 Vitamin D deficiency, unspecified: Secondary | ICD-10-CM | POA: Diagnosis not present

## 2023-02-23 DIAGNOSIS — N189 Chronic kidney disease, unspecified: Secondary | ICD-10-CM | POA: Diagnosis not present

## 2023-02-23 DIAGNOSIS — E1129 Type 2 diabetes mellitus with other diabetic kidney complication: Secondary | ICD-10-CM | POA: Diagnosis not present

## 2023-03-02 DIAGNOSIS — E0921 Drug or chemical induced diabetes mellitus with diabetic nephropathy: Secondary | ICD-10-CM | POA: Diagnosis not present

## 2023-03-02 DIAGNOSIS — Z940 Kidney transplant status: Secondary | ICD-10-CM | POA: Diagnosis not present

## 2023-03-02 DIAGNOSIS — N390 Urinary tract infection, site not specified: Secondary | ICD-10-CM | POA: Diagnosis not present

## 2023-03-02 DIAGNOSIS — N186 End stage renal disease: Secondary | ICD-10-CM | POA: Diagnosis not present

## 2023-03-05 DIAGNOSIS — N1832 Chronic kidney disease, stage 3b: Secondary | ICD-10-CM | POA: Diagnosis not present

## 2023-03-05 DIAGNOSIS — D649 Anemia, unspecified: Secondary | ICD-10-CM | POA: Diagnosis not present

## 2023-03-05 DIAGNOSIS — Z940 Kidney transplant status: Secondary | ICD-10-CM | POA: Diagnosis not present

## 2023-03-07 DIAGNOSIS — H31002 Unspecified chorioretinal scars, left eye: Secondary | ICD-10-CM | POA: Diagnosis not present

## 2023-03-07 DIAGNOSIS — Z961 Presence of intraocular lens: Secondary | ICD-10-CM | POA: Diagnosis not present

## 2023-04-11 DIAGNOSIS — F419 Anxiety disorder, unspecified: Secondary | ICD-10-CM | POA: Diagnosis not present

## 2023-04-11 DIAGNOSIS — F200 Paranoid schizophrenia: Secondary | ICD-10-CM | POA: Diagnosis not present

## 2023-04-11 DIAGNOSIS — F32A Depression, unspecified: Secondary | ICD-10-CM | POA: Diagnosis not present

## 2023-04-11 DIAGNOSIS — M1990 Unspecified osteoarthritis, unspecified site: Secondary | ICD-10-CM | POA: Diagnosis not present

## 2023-04-25 DIAGNOSIS — F01A0 Vascular dementia, mild, without behavioral disturbance, psychotic disturbance, mood disturbance, and anxiety: Secondary | ICD-10-CM | POA: Diagnosis not present

## 2023-04-25 DIAGNOSIS — I10 Essential (primary) hypertension: Secondary | ICD-10-CM | POA: Diagnosis not present

## 2023-04-25 DIAGNOSIS — F321 Major depressive disorder, single episode, moderate: Secondary | ICD-10-CM | POA: Diagnosis not present

## 2023-04-25 DIAGNOSIS — F411 Generalized anxiety disorder: Secondary | ICD-10-CM | POA: Diagnosis not present

## 2023-04-25 DIAGNOSIS — F200 Paranoid schizophrenia: Secondary | ICD-10-CM | POA: Diagnosis not present

## 2023-04-26 DIAGNOSIS — Z940 Kidney transplant status: Secondary | ICD-10-CM | POA: Diagnosis not present

## 2023-04-26 DIAGNOSIS — N390 Urinary tract infection, site not specified: Secondary | ICD-10-CM | POA: Diagnosis not present

## 2023-04-26 DIAGNOSIS — N186 End stage renal disease: Secondary | ICD-10-CM | POA: Diagnosis not present

## 2023-04-26 DIAGNOSIS — E0921 Drug or chemical induced diabetes mellitus with diabetic nephropathy: Secondary | ICD-10-CM | POA: Diagnosis not present

## 2023-05-07 DIAGNOSIS — Z940 Kidney transplant status: Secondary | ICD-10-CM | POA: Diagnosis not present

## 2023-05-07 DIAGNOSIS — I10 Essential (primary) hypertension: Secondary | ICD-10-CM | POA: Diagnosis not present

## 2023-05-07 DIAGNOSIS — F0390 Unspecified dementia without behavioral disturbance: Secondary | ICD-10-CM | POA: Diagnosis not present

## 2023-05-07 DIAGNOSIS — F200 Paranoid schizophrenia: Secondary | ICD-10-CM | POA: Diagnosis not present

## 2023-05-30 DIAGNOSIS — F419 Anxiety disorder, unspecified: Secondary | ICD-10-CM | POA: Diagnosis not present

## 2023-05-30 DIAGNOSIS — F32A Depression, unspecified: Secondary | ICD-10-CM | POA: Diagnosis not present

## 2023-05-30 DIAGNOSIS — F200 Paranoid schizophrenia: Secondary | ICD-10-CM | POA: Diagnosis not present

## 2023-05-30 DIAGNOSIS — F03918 Unspecified dementia, unspecified severity, with other behavioral disturbance: Secondary | ICD-10-CM | POA: Diagnosis not present

## 2023-06-01 DIAGNOSIS — Z940 Kidney transplant status: Secondary | ICD-10-CM | POA: Diagnosis not present

## 2023-06-01 DIAGNOSIS — F209 Schizophrenia, unspecified: Secondary | ICD-10-CM | POA: Diagnosis not present

## 2023-06-01 DIAGNOSIS — Z7960 Long term (current) use of unspecified immunomodulators and immunosuppressants: Secondary | ICD-10-CM | POA: Diagnosis not present

## 2023-06-01 DIAGNOSIS — I129 Hypertensive chronic kidney disease with stage 1 through stage 4 chronic kidney disease, or unspecified chronic kidney disease: Secondary | ICD-10-CM | POA: Diagnosis not present

## 2023-06-01 DIAGNOSIS — D72819 Decreased white blood cell count, unspecified: Secondary | ICD-10-CM | POA: Diagnosis not present

## 2023-06-01 DIAGNOSIS — D631 Anemia in chronic kidney disease: Secondary | ICD-10-CM | POA: Diagnosis not present

## 2023-06-01 DIAGNOSIS — N189 Chronic kidney disease, unspecified: Secondary | ICD-10-CM | POA: Diagnosis not present

## 2023-06-01 DIAGNOSIS — E1129 Type 2 diabetes mellitus with other diabetic kidney complication: Secondary | ICD-10-CM | POA: Diagnosis not present

## 2023-06-01 DIAGNOSIS — E559 Vitamin D deficiency, unspecified: Secondary | ICD-10-CM | POA: Diagnosis not present

## 2023-06-19 DIAGNOSIS — Z23 Encounter for immunization: Secondary | ICD-10-CM | POA: Diagnosis not present

## 2023-06-25 DIAGNOSIS — I10 Essential (primary) hypertension: Secondary | ICD-10-CM | POA: Diagnosis not present

## 2023-06-25 DIAGNOSIS — F01A0 Vascular dementia, mild, without behavioral disturbance, psychotic disturbance, mood disturbance, and anxiety: Secondary | ICD-10-CM | POA: Diagnosis not present

## 2023-06-25 DIAGNOSIS — F200 Paranoid schizophrenia: Secondary | ICD-10-CM | POA: Diagnosis not present

## 2023-06-25 DIAGNOSIS — F321 Major depressive disorder, single episode, moderate: Secondary | ICD-10-CM | POA: Diagnosis not present

## 2023-06-25 DIAGNOSIS — F411 Generalized anxiety disorder: Secondary | ICD-10-CM | POA: Diagnosis not present

## 2023-07-05 DIAGNOSIS — F03A0 Unspecified dementia, mild, without behavioral disturbance, psychotic disturbance, mood disturbance, and anxiety: Secondary | ICD-10-CM | POA: Diagnosis not present

## 2023-07-05 DIAGNOSIS — F200 Paranoid schizophrenia: Secondary | ICD-10-CM | POA: Diagnosis not present

## 2023-07-05 DIAGNOSIS — I10 Essential (primary) hypertension: Secondary | ICD-10-CM | POA: Diagnosis not present

## 2023-07-05 DIAGNOSIS — Z940 Kidney transplant status: Secondary | ICD-10-CM | POA: Diagnosis not present

## 2023-08-01 DIAGNOSIS — I10 Essential (primary) hypertension: Secondary | ICD-10-CM | POA: Diagnosis not present

## 2023-08-01 DIAGNOSIS — F03918 Unspecified dementia, unspecified severity, with other behavioral disturbance: Secondary | ICD-10-CM | POA: Diagnosis not present

## 2023-08-01 DIAGNOSIS — F200 Paranoid schizophrenia: Secondary | ICD-10-CM | POA: Diagnosis not present

## 2023-08-01 DIAGNOSIS — F32A Depression, unspecified: Secondary | ICD-10-CM | POA: Diagnosis not present

## 2023-08-27 DIAGNOSIS — Z940 Kidney transplant status: Secondary | ICD-10-CM | POA: Diagnosis not present

## 2023-08-27 DIAGNOSIS — N186 End stage renal disease: Secondary | ICD-10-CM | POA: Diagnosis not present

## 2023-08-27 DIAGNOSIS — E0921 Drug or chemical induced diabetes mellitus with diabetic nephropathy: Secondary | ICD-10-CM | POA: Diagnosis not present

## 2023-08-27 DIAGNOSIS — N390 Urinary tract infection, site not specified: Secondary | ICD-10-CM | POA: Diagnosis not present

## 2023-08-28 DIAGNOSIS — D649 Anemia, unspecified: Secondary | ICD-10-CM | POA: Diagnosis not present

## 2023-08-28 DIAGNOSIS — Z940 Kidney transplant status: Secondary | ICD-10-CM | POA: Diagnosis not present

## 2023-08-30 DIAGNOSIS — F0390 Unspecified dementia without behavioral disturbance: Secondary | ICD-10-CM | POA: Diagnosis not present

## 2023-08-30 DIAGNOSIS — F200 Paranoid schizophrenia: Secondary | ICD-10-CM | POA: Diagnosis not present

## 2023-08-30 DIAGNOSIS — Z940 Kidney transplant status: Secondary | ICD-10-CM | POA: Diagnosis not present

## 2023-08-30 DIAGNOSIS — N1832 Chronic kidney disease, stage 3b: Secondary | ICD-10-CM | POA: Diagnosis not present

## 2023-10-03 DIAGNOSIS — F03918 Unspecified dementia, unspecified severity, with other behavioral disturbance: Secondary | ICD-10-CM | POA: Diagnosis not present

## 2023-10-03 DIAGNOSIS — F419 Anxiety disorder, unspecified: Secondary | ICD-10-CM | POA: Diagnosis not present

## 2023-10-03 DIAGNOSIS — F200 Paranoid schizophrenia: Secondary | ICD-10-CM | POA: Diagnosis not present

## 2023-10-03 DIAGNOSIS — F32A Depression, unspecified: Secondary | ICD-10-CM | POA: Diagnosis not present

## 2023-10-04 DIAGNOSIS — N189 Chronic kidney disease, unspecified: Secondary | ICD-10-CM | POA: Diagnosis not present

## 2023-10-04 DIAGNOSIS — Z940 Kidney transplant status: Secondary | ICD-10-CM | POA: Diagnosis not present

## 2023-10-04 DIAGNOSIS — Z7960 Long term (current) use of unspecified immunomodulators and immunosuppressants: Secondary | ICD-10-CM | POA: Diagnosis not present

## 2023-10-04 DIAGNOSIS — E1129 Type 2 diabetes mellitus with other diabetic kidney complication: Secondary | ICD-10-CM | POA: Diagnosis not present

## 2023-10-04 DIAGNOSIS — E559 Vitamin D deficiency, unspecified: Secondary | ICD-10-CM | POA: Diagnosis not present

## 2023-10-04 DIAGNOSIS — D631 Anemia in chronic kidney disease: Secondary | ICD-10-CM | POA: Diagnosis not present

## 2023-10-04 DIAGNOSIS — F209 Schizophrenia, unspecified: Secondary | ICD-10-CM | POA: Diagnosis not present

## 2023-10-04 DIAGNOSIS — I129 Hypertensive chronic kidney disease with stage 1 through stage 4 chronic kidney disease, or unspecified chronic kidney disease: Secondary | ICD-10-CM | POA: Diagnosis not present

## 2023-10-04 DIAGNOSIS — D72819 Decreased white blood cell count, unspecified: Secondary | ICD-10-CM | POA: Diagnosis not present

## 2023-10-21 DIAGNOSIS — R109 Unspecified abdominal pain: Secondary | ICD-10-CM | POA: Diagnosis not present

## 2023-10-27 DIAGNOSIS — N186 End stage renal disease: Secondary | ICD-10-CM | POA: Diagnosis not present

## 2023-10-27 DIAGNOSIS — N390 Urinary tract infection, site not specified: Secondary | ICD-10-CM | POA: Diagnosis not present

## 2023-10-27 DIAGNOSIS — Z940 Kidney transplant status: Secondary | ICD-10-CM | POA: Diagnosis not present

## 2023-10-27 DIAGNOSIS — E0921 Drug or chemical induced diabetes mellitus with diabetic nephropathy: Secondary | ICD-10-CM | POA: Diagnosis not present

## 2023-10-30 DIAGNOSIS — F03A4 Unspecified dementia, mild, with anxiety: Secondary | ICD-10-CM | POA: Diagnosis not present

## 2023-10-30 DIAGNOSIS — I10 Essential (primary) hypertension: Secondary | ICD-10-CM | POA: Diagnosis not present

## 2023-10-30 DIAGNOSIS — Z940 Kidney transplant status: Secondary | ICD-10-CM | POA: Diagnosis not present

## 2023-10-30 DIAGNOSIS — F200 Paranoid schizophrenia: Secondary | ICD-10-CM | POA: Diagnosis not present

## 2023-11-02 ENCOUNTER — Other Ambulatory Visit: Payer: Self-pay

## 2023-11-02 DIAGNOSIS — F321 Major depressive disorder, single episode, moderate: Secondary | ICD-10-CM | POA: Diagnosis not present

## 2023-11-02 DIAGNOSIS — I10 Essential (primary) hypertension: Secondary | ICD-10-CM | POA: Diagnosis not present

## 2023-11-02 DIAGNOSIS — F411 Generalized anxiety disorder: Secondary | ICD-10-CM | POA: Diagnosis not present

## 2023-11-02 DIAGNOSIS — N186 End stage renal disease: Secondary | ICD-10-CM

## 2023-11-02 DIAGNOSIS — F200 Paranoid schizophrenia: Secondary | ICD-10-CM | POA: Diagnosis not present

## 2023-11-02 DIAGNOSIS — F01A0 Vascular dementia, mild, without behavioral disturbance, psychotic disturbance, mood disturbance, and anxiety: Secondary | ICD-10-CM | POA: Diagnosis not present

## 2023-11-05 ENCOUNTER — Telehealth: Payer: Self-pay | Admitting: *Deleted

## 2023-11-05 DIAGNOSIS — N186 End stage renal disease: Secondary | ICD-10-CM | POA: Diagnosis not present

## 2023-11-05 DIAGNOSIS — D649 Anemia, unspecified: Secondary | ICD-10-CM | POA: Diagnosis not present

## 2023-11-05 DIAGNOSIS — N390 Urinary tract infection, site not specified: Secondary | ICD-10-CM | POA: Diagnosis not present

## 2023-11-05 DIAGNOSIS — E0921 Drug or chemical induced diabetes mellitus with diabetic nephropathy: Secondary | ICD-10-CM | POA: Diagnosis not present

## 2023-11-05 DIAGNOSIS — Z940 Kidney transplant status: Secondary | ICD-10-CM | POA: Diagnosis not present

## 2023-11-05 NOTE — Progress Notes (Signed)
 Complex Care Management Note  Care Guide Note 11/05/2023 Name: ITALY WARRINER MRN: [id number] DOB: [birth year]  ELONA YINGER is a 73 y.o. year old female who sees Patient, No Pcp Per for primary care. I reached out to Brunetta Genera by phone today to offer complex care management services.  Ms. Kunz was given information about Complex Care Management services today including:   The Complex Care Management services include support from the care team which includes your Nurse Care Manager, Clinical Social Worker, or Pharmacist.  The Complex Care Management team is here to help remove barriers to the health concerns and goals most important to you. Complex Care Management services are voluntary, and the patient may decline or stop services at any time by request to their care team member.   Complex Care Management Consent Status: Patient did not agree to participate in complex care management services at this time.  Follow up plan:  pt in SNF [facility]   Encounter Outcome:  Patient Refused  Burman Nieves, CMA, Care Guide [facility]  [facility], [facility] Guide Direct Dial: [phone]  Fax: [phone] Website: [email or web address]

## 2023-11-19 DIAGNOSIS — F320 Major depressive disorder, single episode, mild: Secondary | ICD-10-CM | POA: Diagnosis not present

## 2023-11-28 DIAGNOSIS — F32A Depression, unspecified: Secondary | ICD-10-CM | POA: Diagnosis not present

## 2023-11-28 DIAGNOSIS — F419 Anxiety disorder, unspecified: Secondary | ICD-10-CM | POA: Diagnosis not present

## 2023-11-28 DIAGNOSIS — F200 Paranoid schizophrenia: Secondary | ICD-10-CM | POA: Diagnosis not present

## 2023-11-28 DIAGNOSIS — I10 Essential (primary) hypertension: Secondary | ICD-10-CM | POA: Diagnosis not present

## 2023-12-10 DIAGNOSIS — D638 Anemia in other chronic diseases classified elsewhere: Secondary | ICD-10-CM | POA: Diagnosis not present

## 2023-12-10 DIAGNOSIS — F320 Major depressive disorder, single episode, mild: Secondary | ICD-10-CM | POA: Diagnosis not present

## 2023-12-10 DIAGNOSIS — N390 Urinary tract infection, site not specified: Secondary | ICD-10-CM | POA: Diagnosis not present

## 2023-12-10 DIAGNOSIS — E46 Unspecified protein-calorie malnutrition: Secondary | ICD-10-CM | POA: Diagnosis not present

## 2023-12-10 DIAGNOSIS — N1832 Chronic kidney disease, stage 3b: Secondary | ICD-10-CM | POA: Diagnosis not present

## 2023-12-10 DIAGNOSIS — F411 Generalized anxiety disorder: Secondary | ICD-10-CM | POA: Diagnosis not present

## 2023-12-10 DIAGNOSIS — Z940 Kidney transplant status: Secondary | ICD-10-CM | POA: Diagnosis not present

## 2023-12-10 DIAGNOSIS — M1990 Unspecified osteoarthritis, unspecified site: Secondary | ICD-10-CM | POA: Diagnosis not present

## 2023-12-11 DIAGNOSIS — N390 Urinary tract infection, site not specified: Secondary | ICD-10-CM | POA: Diagnosis not present

## 2023-12-12 DIAGNOSIS — F320 Major depressive disorder, single episode, mild: Secondary | ICD-10-CM | POA: Diagnosis not present

## 2023-12-20 DIAGNOSIS — I10 Essential (primary) hypertension: Secondary | ICD-10-CM | POA: Diagnosis not present

## 2023-12-20 DIAGNOSIS — F321 Major depressive disorder, single episode, moderate: Secondary | ICD-10-CM | POA: Diagnosis not present

## 2023-12-20 DIAGNOSIS — F200 Paranoid schizophrenia: Secondary | ICD-10-CM | POA: Diagnosis not present

## 2023-12-20 DIAGNOSIS — F411 Generalized anxiety disorder: Secondary | ICD-10-CM | POA: Diagnosis not present

## 2023-12-20 DIAGNOSIS — F01A0 Vascular dementia, mild, without behavioral disturbance, psychotic disturbance, mood disturbance, and anxiety: Secondary | ICD-10-CM | POA: Diagnosis not present

## 2023-12-28 DIAGNOSIS — Z940 Kidney transplant status: Secondary | ICD-10-CM | POA: Diagnosis not present

## 2023-12-28 DIAGNOSIS — N390 Urinary tract infection, site not specified: Secondary | ICD-10-CM | POA: Diagnosis not present

## 2023-12-28 DIAGNOSIS — N186 End stage renal disease: Secondary | ICD-10-CM | POA: Diagnosis not present

## 2023-12-28 DIAGNOSIS — E0921 Drug or chemical induced diabetes mellitus with diabetic nephropathy: Secondary | ICD-10-CM | POA: Diagnosis not present

## 2023-12-31 DIAGNOSIS — N1831 Chronic kidney disease, stage 3a: Secondary | ICD-10-CM | POA: Diagnosis not present

## 2023-12-31 DIAGNOSIS — F200 Paranoid schizophrenia: Secondary | ICD-10-CM | POA: Diagnosis not present

## 2023-12-31 DIAGNOSIS — Z940 Kidney transplant status: Secondary | ICD-10-CM | POA: Diagnosis not present

## 2023-12-31 DIAGNOSIS — D649 Anemia, unspecified: Secondary | ICD-10-CM | POA: Diagnosis not present

## 2024-01-02 DIAGNOSIS — N186 End stage renal disease: Secondary | ICD-10-CM | POA: Diagnosis not present

## 2024-01-02 DIAGNOSIS — N1832 Chronic kidney disease, stage 3b: Secondary | ICD-10-CM | POA: Diagnosis not present

## 2024-01-07 DIAGNOSIS — N189 Chronic kidney disease, unspecified: Secondary | ICD-10-CM | POA: Diagnosis not present

## 2024-01-07 DIAGNOSIS — Z940 Kidney transplant status: Secondary | ICD-10-CM | POA: Diagnosis not present

## 2024-01-08 DIAGNOSIS — F321 Major depressive disorder, single episode, moderate: Secondary | ICD-10-CM | POA: Diagnosis not present

## 2024-01-08 DIAGNOSIS — I10 Essential (primary) hypertension: Secondary | ICD-10-CM | POA: Diagnosis not present

## 2024-01-08 DIAGNOSIS — F411 Generalized anxiety disorder: Secondary | ICD-10-CM | POA: Diagnosis not present

## 2024-01-08 DIAGNOSIS — F200 Paranoid schizophrenia: Secondary | ICD-10-CM | POA: Diagnosis not present

## 2024-01-08 DIAGNOSIS — F01A0 Vascular dementia, mild, without behavioral disturbance, psychotic disturbance, mood disturbance, and anxiety: Secondary | ICD-10-CM | POA: Diagnosis not present

## 2024-01-17 DIAGNOSIS — E559 Vitamin D deficiency, unspecified: Secondary | ICD-10-CM | POA: Diagnosis not present

## 2024-01-17 DIAGNOSIS — D631 Anemia in chronic kidney disease: Secondary | ICD-10-CM | POA: Diagnosis not present

## 2024-01-17 DIAGNOSIS — E1129 Type 2 diabetes mellitus with other diabetic kidney complication: Secondary | ICD-10-CM | POA: Diagnosis not present

## 2024-01-17 DIAGNOSIS — Z940 Kidney transplant status: Secondary | ICD-10-CM | POA: Diagnosis not present

## 2024-01-17 DIAGNOSIS — Z7960 Long term (current) use of unspecified immunomodulators and immunosuppressants: Secondary | ICD-10-CM | POA: Diagnosis not present

## 2024-01-17 DIAGNOSIS — I129 Hypertensive chronic kidney disease with stage 1 through stage 4 chronic kidney disease, or unspecified chronic kidney disease: Secondary | ICD-10-CM | POA: Diagnosis not present

## 2024-01-17 DIAGNOSIS — F209 Schizophrenia, unspecified: Secondary | ICD-10-CM | POA: Diagnosis not present

## 2024-01-17 DIAGNOSIS — N189 Chronic kidney disease, unspecified: Secondary | ICD-10-CM | POA: Diagnosis not present

## 2024-01-17 DIAGNOSIS — D72819 Decreased white blood cell count, unspecified: Secondary | ICD-10-CM | POA: Diagnosis not present

## 2024-01-22 DIAGNOSIS — F321 Major depressive disorder, single episode, moderate: Secondary | ICD-10-CM | POA: Diagnosis not present

## 2024-01-22 DIAGNOSIS — L03115 Cellulitis of right lower limb: Secondary | ICD-10-CM | POA: Diagnosis not present

## 2024-01-22 DIAGNOSIS — F01A0 Vascular dementia, mild, without behavioral disturbance, psychotic disturbance, mood disturbance, and anxiety: Secondary | ICD-10-CM | POA: Diagnosis not present

## 2024-01-22 DIAGNOSIS — I10 Essential (primary) hypertension: Secondary | ICD-10-CM | POA: Diagnosis not present

## 2024-01-22 DIAGNOSIS — F411 Generalized anxiety disorder: Secondary | ICD-10-CM | POA: Diagnosis not present

## 2024-01-22 DIAGNOSIS — F200 Paranoid schizophrenia: Secondary | ICD-10-CM | POA: Diagnosis not present

## 2024-02-05 DIAGNOSIS — F200 Paranoid schizophrenia: Secondary | ICD-10-CM | POA: Diagnosis not present

## 2024-02-05 DIAGNOSIS — F01A0 Vascular dementia, mild, without behavioral disturbance, psychotic disturbance, mood disturbance, and anxiety: Secondary | ICD-10-CM | POA: Diagnosis not present

## 2024-02-05 DIAGNOSIS — F321 Major depressive disorder, single episode, moderate: Secondary | ICD-10-CM | POA: Diagnosis not present

## 2024-02-05 DIAGNOSIS — F411 Generalized anxiety disorder: Secondary | ICD-10-CM | POA: Diagnosis not present

## 2024-02-05 DIAGNOSIS — I10 Essential (primary) hypertension: Secondary | ICD-10-CM | POA: Diagnosis not present

## 2024-02-06 DIAGNOSIS — F32A Depression, unspecified: Secondary | ICD-10-CM | POA: Diagnosis not present

## 2024-02-06 DIAGNOSIS — F200 Paranoid schizophrenia: Secondary | ICD-10-CM | POA: Diagnosis not present

## 2024-02-06 DIAGNOSIS — I10 Essential (primary) hypertension: Secondary | ICD-10-CM | POA: Diagnosis not present

## 2024-02-06 DIAGNOSIS — F419 Anxiety disorder, unspecified: Secondary | ICD-10-CM | POA: Diagnosis not present

## 2024-02-19 DIAGNOSIS — F200 Paranoid schizophrenia: Secondary | ICD-10-CM | POA: Diagnosis not present

## 2024-02-19 DIAGNOSIS — I10 Essential (primary) hypertension: Secondary | ICD-10-CM | POA: Diagnosis not present

## 2024-02-19 DIAGNOSIS — F411 Generalized anxiety disorder: Secondary | ICD-10-CM | POA: Diagnosis not present

## 2024-02-19 DIAGNOSIS — F01A0 Vascular dementia, mild, without behavioral disturbance, psychotic disturbance, mood disturbance, and anxiety: Secondary | ICD-10-CM | POA: Diagnosis not present

## 2024-02-19 DIAGNOSIS — F321 Major depressive disorder, single episode, moderate: Secondary | ICD-10-CM | POA: Diagnosis not present

## 2024-02-27 DIAGNOSIS — N186 End stage renal disease: Secondary | ICD-10-CM | POA: Diagnosis not present

## 2024-02-27 DIAGNOSIS — Z940 Kidney transplant status: Secondary | ICD-10-CM | POA: Diagnosis not present

## 2024-02-27 DIAGNOSIS — E0921 Drug or chemical induced diabetes mellitus with diabetic nephropathy: Secondary | ICD-10-CM | POA: Diagnosis not present

## 2024-02-27 DIAGNOSIS — N390 Urinary tract infection, site not specified: Secondary | ICD-10-CM | POA: Diagnosis not present

## 2024-03-03 DIAGNOSIS — N1831 Chronic kidney disease, stage 3a: Secondary | ICD-10-CM | POA: Diagnosis not present

## 2024-03-03 DIAGNOSIS — F411 Generalized anxiety disorder: Secondary | ICD-10-CM | POA: Diagnosis not present

## 2024-03-03 DIAGNOSIS — F200 Paranoid schizophrenia: Secondary | ICD-10-CM | POA: Diagnosis not present

## 2024-03-03 DIAGNOSIS — Z940 Kidney transplant status: Secondary | ICD-10-CM | POA: Diagnosis not present

## 2024-03-04 DIAGNOSIS — F321 Major depressive disorder, single episode, moderate: Secondary | ICD-10-CM | POA: Diagnosis not present

## 2024-03-04 DIAGNOSIS — F411 Generalized anxiety disorder: Secondary | ICD-10-CM | POA: Diagnosis not present

## 2024-03-04 DIAGNOSIS — F200 Paranoid schizophrenia: Secondary | ICD-10-CM | POA: Diagnosis not present

## 2024-03-04 DIAGNOSIS — F01A0 Vascular dementia, mild, without behavioral disturbance, psychotic disturbance, mood disturbance, and anxiety: Secondary | ICD-10-CM | POA: Diagnosis not present

## 2024-03-04 DIAGNOSIS — I10 Essential (primary) hypertension: Secondary | ICD-10-CM | POA: Diagnosis not present

## 2024-03-18 DIAGNOSIS — F200 Paranoid schizophrenia: Secondary | ICD-10-CM | POA: Diagnosis not present

## 2024-03-18 DIAGNOSIS — I10 Essential (primary) hypertension: Secondary | ICD-10-CM | POA: Diagnosis not present

## 2024-03-18 DIAGNOSIS — F01A0 Vascular dementia, mild, without behavioral disturbance, psychotic disturbance, mood disturbance, and anxiety: Secondary | ICD-10-CM | POA: Diagnosis not present

## 2024-03-18 DIAGNOSIS — F321 Major depressive disorder, single episode, moderate: Secondary | ICD-10-CM | POA: Diagnosis not present

## 2024-03-18 DIAGNOSIS — F411 Generalized anxiety disorder: Secondary | ICD-10-CM | POA: Diagnosis not present

## 2024-03-26 DIAGNOSIS — N390 Urinary tract infection, site not specified: Secondary | ICD-10-CM | POA: Diagnosis not present

## 2024-03-27 DIAGNOSIS — N390 Urinary tract infection, site not specified: Secondary | ICD-10-CM | POA: Diagnosis not present

## 2024-04-01 DIAGNOSIS — F411 Generalized anxiety disorder: Secondary | ICD-10-CM | POA: Diagnosis not present

## 2024-04-01 DIAGNOSIS — I10 Essential (primary) hypertension: Secondary | ICD-10-CM | POA: Diagnosis not present

## 2024-04-01 DIAGNOSIS — F01A0 Vascular dementia, mild, without behavioral disturbance, psychotic disturbance, mood disturbance, and anxiety: Secondary | ICD-10-CM | POA: Diagnosis not present

## 2024-04-01 DIAGNOSIS — F200 Paranoid schizophrenia: Secondary | ICD-10-CM | POA: Diagnosis not present

## 2024-04-01 DIAGNOSIS — F321 Major depressive disorder, single episode, moderate: Secondary | ICD-10-CM | POA: Diagnosis not present

## 2024-04-09 DIAGNOSIS — I10 Essential (primary) hypertension: Secondary | ICD-10-CM | POA: Diagnosis not present

## 2024-04-09 DIAGNOSIS — R110 Nausea: Secondary | ICD-10-CM | POA: Diagnosis not present

## 2024-04-09 DIAGNOSIS — N189 Chronic kidney disease, unspecified: Secondary | ICD-10-CM | POA: Diagnosis not present

## 2024-04-09 DIAGNOSIS — F200 Paranoid schizophrenia: Secondary | ICD-10-CM | POA: Diagnosis not present

## 2024-04-15 DIAGNOSIS — F01A0 Vascular dementia, mild, without behavioral disturbance, psychotic disturbance, mood disturbance, and anxiety: Secondary | ICD-10-CM | POA: Diagnosis not present

## 2024-04-15 DIAGNOSIS — F321 Major depressive disorder, single episode, moderate: Secondary | ICD-10-CM | POA: Diagnosis not present

## 2024-04-15 DIAGNOSIS — F411 Generalized anxiety disorder: Secondary | ICD-10-CM | POA: Diagnosis not present

## 2024-04-15 DIAGNOSIS — F200 Paranoid schizophrenia: Secondary | ICD-10-CM | POA: Diagnosis not present

## 2024-04-15 DIAGNOSIS — I10 Essential (primary) hypertension: Secondary | ICD-10-CM | POA: Diagnosis not present

## 2024-04-29 DIAGNOSIS — F411 Generalized anxiety disorder: Secondary | ICD-10-CM | POA: Diagnosis not present

## 2024-04-29 DIAGNOSIS — F200 Paranoid schizophrenia: Secondary | ICD-10-CM | POA: Diagnosis not present

## 2024-04-29 DIAGNOSIS — F321 Major depressive disorder, single episode, moderate: Secondary | ICD-10-CM | POA: Diagnosis not present

## 2024-05-02 DIAGNOSIS — N186 End stage renal disease: Secondary | ICD-10-CM | POA: Diagnosis not present

## 2024-05-02 DIAGNOSIS — E0921 Drug or chemical induced diabetes mellitus with diabetic nephropathy: Secondary | ICD-10-CM | POA: Diagnosis not present

## 2024-05-02 DIAGNOSIS — N390 Urinary tract infection, site not specified: Secondary | ICD-10-CM | POA: Diagnosis not present

## 2024-05-02 DIAGNOSIS — Z940 Kidney transplant status: Secondary | ICD-10-CM | POA: Diagnosis not present

## 2024-05-05 DIAGNOSIS — F411 Generalized anxiety disorder: Secondary | ICD-10-CM | POA: Diagnosis not present

## 2024-05-05 DIAGNOSIS — N1831 Chronic kidney disease, stage 3a: Secondary | ICD-10-CM | POA: Diagnosis not present

## 2024-05-05 DIAGNOSIS — I10 Essential (primary) hypertension: Secondary | ICD-10-CM | POA: Diagnosis not present

## 2024-05-05 DIAGNOSIS — F200 Paranoid schizophrenia: Secondary | ICD-10-CM | POA: Diagnosis not present

## 2024-05-07 DIAGNOSIS — H47323 Drusen of optic disc, bilateral: Secondary | ICD-10-CM | POA: Diagnosis not present

## 2024-05-07 DIAGNOSIS — Z961 Presence of intraocular lens: Secondary | ICD-10-CM | POA: Diagnosis not present

## 2024-05-07 DIAGNOSIS — H524 Presbyopia: Secondary | ICD-10-CM | POA: Diagnosis not present

## 2024-05-07 DIAGNOSIS — H31002 Unspecified chorioretinal scars, left eye: Secondary | ICD-10-CM | POA: Diagnosis not present

## 2024-05-13 DIAGNOSIS — F411 Generalized anxiety disorder: Secondary | ICD-10-CM | POA: Diagnosis not present

## 2024-05-13 DIAGNOSIS — F01A0 Vascular dementia, mild, without behavioral disturbance, psychotic disturbance, mood disturbance, and anxiety: Secondary | ICD-10-CM | POA: Diagnosis not present

## 2024-05-13 DIAGNOSIS — N1832 Chronic kidney disease, stage 3b: Secondary | ICD-10-CM | POA: Diagnosis not present

## 2024-05-13 DIAGNOSIS — F321 Major depressive disorder, single episode, moderate: Secondary | ICD-10-CM | POA: Diagnosis not present

## 2024-05-13 DIAGNOSIS — D649 Anemia, unspecified: Secondary | ICD-10-CM | POA: Diagnosis not present

## 2024-05-13 DIAGNOSIS — F200 Paranoid schizophrenia: Secondary | ICD-10-CM | POA: Diagnosis not present

## 2024-05-13 DIAGNOSIS — Z940 Kidney transplant status: Secondary | ICD-10-CM | POA: Diagnosis not present

## 2024-05-13 DIAGNOSIS — I10 Essential (primary) hypertension: Secondary | ICD-10-CM | POA: Diagnosis not present

## 2024-05-20 DIAGNOSIS — Z940 Kidney transplant status: Secondary | ICD-10-CM | POA: Diagnosis not present

## 2024-05-20 DIAGNOSIS — I129 Hypertensive chronic kidney disease with stage 1 through stage 4 chronic kidney disease, or unspecified chronic kidney disease: Secondary | ICD-10-CM | POA: Diagnosis not present

## 2024-05-20 DIAGNOSIS — F209 Schizophrenia, unspecified: Secondary | ICD-10-CM | POA: Diagnosis not present

## 2024-05-20 DIAGNOSIS — E1129 Type 2 diabetes mellitus with other diabetic kidney complication: Secondary | ICD-10-CM | POA: Diagnosis not present

## 2024-05-20 DIAGNOSIS — N189 Chronic kidney disease, unspecified: Secondary | ICD-10-CM | POA: Diagnosis not present

## 2024-05-20 DIAGNOSIS — Z7960 Long term (current) use of unspecified immunomodulators and immunosuppressants: Secondary | ICD-10-CM | POA: Diagnosis not present

## 2024-05-20 DIAGNOSIS — D631 Anemia in chronic kidney disease: Secondary | ICD-10-CM | POA: Diagnosis not present

## 2024-05-20 DIAGNOSIS — D72819 Decreased white blood cell count, unspecified: Secondary | ICD-10-CM | POA: Diagnosis not present

## 2024-05-20 DIAGNOSIS — E559 Vitamin D deficiency, unspecified: Secondary | ICD-10-CM | POA: Diagnosis not present

## 2024-05-27 DIAGNOSIS — F411 Generalized anxiety disorder: Secondary | ICD-10-CM | POA: Diagnosis not present

## 2024-05-27 DIAGNOSIS — F321 Major depressive disorder, single episode, moderate: Secondary | ICD-10-CM | POA: Diagnosis not present

## 2024-05-27 DIAGNOSIS — I10 Essential (primary) hypertension: Secondary | ICD-10-CM | POA: Diagnosis not present

## 2024-05-27 DIAGNOSIS — F01A0 Vascular dementia, mild, without behavioral disturbance, psychotic disturbance, mood disturbance, and anxiety: Secondary | ICD-10-CM | POA: Diagnosis not present

## 2024-05-27 DIAGNOSIS — F200 Paranoid schizophrenia: Secondary | ICD-10-CM | POA: Diagnosis not present

## 2024-05-28 DIAGNOSIS — F200 Paranoid schizophrenia: Secondary | ICD-10-CM | POA: Diagnosis not present

## 2024-05-28 DIAGNOSIS — F419 Anxiety disorder, unspecified: Secondary | ICD-10-CM | POA: Diagnosis not present

## 2024-05-28 DIAGNOSIS — F03918 Unspecified dementia, unspecified severity, with other behavioral disturbance: Secondary | ICD-10-CM | POA: Diagnosis not present

## 2024-05-28 DIAGNOSIS — F32A Depression, unspecified: Secondary | ICD-10-CM | POA: Diagnosis not present

## 2024-06-03 DIAGNOSIS — R109 Unspecified abdominal pain: Secondary | ICD-10-CM | POA: Diagnosis not present

## 2024-06-03 DIAGNOSIS — R0989 Other specified symptoms and signs involving the circulatory and respiratory systems: Secondary | ICD-10-CM | POA: Diagnosis not present

## 2024-06-03 DIAGNOSIS — R509 Fever, unspecified: Secondary | ICD-10-CM | POA: Diagnosis not present

## 2024-06-03 DIAGNOSIS — R410 Disorientation, unspecified: Secondary | ICD-10-CM | POA: Diagnosis not present

## 2024-06-05 DIAGNOSIS — N390 Urinary tract infection, site not specified: Secondary | ICD-10-CM | POA: Diagnosis not present

## 2024-06-05 DIAGNOSIS — D649 Anemia, unspecified: Secondary | ICD-10-CM | POA: Diagnosis not present

## 2024-06-05 DIAGNOSIS — I501 Left ventricular failure: Secondary | ICD-10-CM | POA: Diagnosis not present

## 2024-06-05 DIAGNOSIS — N1832 Chronic kidney disease, stage 3b: Secondary | ICD-10-CM | POA: Diagnosis not present

## 2024-06-05 DIAGNOSIS — D72829 Elevated white blood cell count, unspecified: Secondary | ICD-10-CM | POA: Diagnosis not present

## 2024-06-05 DIAGNOSIS — D638 Anemia in other chronic diseases classified elsewhere: Secondary | ICD-10-CM | POA: Diagnosis not present

## 2024-06-06 DIAGNOSIS — D649 Anemia, unspecified: Secondary | ICD-10-CM | POA: Diagnosis not present

## 2024-06-06 DIAGNOSIS — D72829 Elevated white blood cell count, unspecified: Secondary | ICD-10-CM | POA: Diagnosis not present

## 2024-06-06 DIAGNOSIS — R109 Unspecified abdominal pain: Secondary | ICD-10-CM | POA: Diagnosis not present

## 2024-06-06 DIAGNOSIS — N1832 Chronic kidney disease, stage 3b: Secondary | ICD-10-CM | POA: Diagnosis not present

## 2024-06-06 DIAGNOSIS — N390 Urinary tract infection, site not specified: Secondary | ICD-10-CM | POA: Diagnosis not present

## 2024-06-09 DIAGNOSIS — D72829 Elevated white blood cell count, unspecified: Secondary | ICD-10-CM | POA: Diagnosis not present

## 2024-06-09 DIAGNOSIS — N1832 Chronic kidney disease, stage 3b: Secondary | ICD-10-CM | POA: Diagnosis not present

## 2024-06-09 DIAGNOSIS — Z91148 Patient's other noncompliance with medication regimen for other reason: Secondary | ICD-10-CM | POA: Diagnosis not present

## 2024-06-10 DIAGNOSIS — D72829 Elevated white blood cell count, unspecified: Secondary | ICD-10-CM | POA: Diagnosis not present

## 2024-06-10 DIAGNOSIS — N390 Urinary tract infection, site not specified: Secondary | ICD-10-CM | POA: Diagnosis not present

## 2024-06-10 DIAGNOSIS — E0921 Drug or chemical induced diabetes mellitus with diabetic nephropathy: Secondary | ICD-10-CM | POA: Diagnosis not present

## 2024-06-10 DIAGNOSIS — N186 End stage renal disease: Secondary | ICD-10-CM | POA: Diagnosis not present

## 2024-06-10 DIAGNOSIS — F411 Generalized anxiety disorder: Secondary | ICD-10-CM | POA: Diagnosis not present

## 2024-06-10 DIAGNOSIS — I10 Essential (primary) hypertension: Secondary | ICD-10-CM | POA: Diagnosis not present

## 2024-06-10 DIAGNOSIS — I501 Left ventricular failure: Secondary | ICD-10-CM | POA: Diagnosis not present

## 2024-06-10 DIAGNOSIS — F321 Major depressive disorder, single episode, moderate: Secondary | ICD-10-CM | POA: Diagnosis not present

## 2024-06-10 DIAGNOSIS — E46 Unspecified protein-calorie malnutrition: Secondary | ICD-10-CM | POA: Diagnosis not present

## 2024-06-10 DIAGNOSIS — Z940 Kidney transplant status: Secondary | ICD-10-CM | POA: Diagnosis not present

## 2024-06-10 DIAGNOSIS — F200 Paranoid schizophrenia: Secondary | ICD-10-CM | POA: Diagnosis not present

## 2024-06-10 DIAGNOSIS — D638 Anemia in other chronic diseases classified elsewhere: Secondary | ICD-10-CM | POA: Diagnosis not present

## 2024-06-10 DIAGNOSIS — N1832 Chronic kidney disease, stage 3b: Secondary | ICD-10-CM | POA: Diagnosis not present

## 2024-06-10 DIAGNOSIS — M1990 Unspecified osteoarthritis, unspecified site: Secondary | ICD-10-CM | POA: Diagnosis not present

## 2024-06-10 DIAGNOSIS — F01A0 Vascular dementia, mild, without behavioral disturbance, psychotic disturbance, mood disturbance, and anxiety: Secondary | ICD-10-CM | POA: Diagnosis not present

## 2024-06-12 DIAGNOSIS — D72829 Elevated white blood cell count, unspecified: Secondary | ICD-10-CM | POA: Diagnosis not present

## 2024-06-12 DIAGNOSIS — D638 Anemia in other chronic diseases classified elsewhere: Secondary | ICD-10-CM | POA: Diagnosis not present

## 2024-06-12 DIAGNOSIS — N1832 Chronic kidney disease, stage 3b: Secondary | ICD-10-CM | POA: Diagnosis not present

## 2024-06-24 DIAGNOSIS — F411 Generalized anxiety disorder: Secondary | ICD-10-CM | POA: Diagnosis not present

## 2024-06-24 DIAGNOSIS — F01A0 Vascular dementia, mild, without behavioral disturbance, psychotic disturbance, mood disturbance, and anxiety: Secondary | ICD-10-CM | POA: Diagnosis not present

## 2024-06-24 DIAGNOSIS — I10 Essential (primary) hypertension: Secondary | ICD-10-CM | POA: Diagnosis not present

## 2024-06-24 DIAGNOSIS — F200 Paranoid schizophrenia: Secondary | ICD-10-CM | POA: Diagnosis not present

## 2024-06-24 DIAGNOSIS — F321 Major depressive disorder, single episode, moderate: Secondary | ICD-10-CM | POA: Diagnosis not present

## 2024-06-27 DIAGNOSIS — E0921 Drug or chemical induced diabetes mellitus with diabetic nephropathy: Secondary | ICD-10-CM | POA: Diagnosis not present

## 2024-06-27 DIAGNOSIS — N186 End stage renal disease: Secondary | ICD-10-CM | POA: Diagnosis not present

## 2024-06-27 DIAGNOSIS — N390 Urinary tract infection, site not specified: Secondary | ICD-10-CM | POA: Diagnosis not present

## 2024-06-27 DIAGNOSIS — Z940 Kidney transplant status: Secondary | ICD-10-CM | POA: Diagnosis not present

## 2024-07-01 DIAGNOSIS — F200 Paranoid schizophrenia: Secondary | ICD-10-CM | POA: Diagnosis not present

## 2024-07-01 DIAGNOSIS — I10 Essential (primary) hypertension: Secondary | ICD-10-CM | POA: Diagnosis not present

## 2024-07-01 DIAGNOSIS — Z940 Kidney transplant status: Secondary | ICD-10-CM | POA: Diagnosis not present

## 2024-07-01 DIAGNOSIS — N1831 Chronic kidney disease, stage 3a: Secondary | ICD-10-CM | POA: Diagnosis not present

## 2024-07-03 DIAGNOSIS — F320 Major depressive disorder, single episode, mild: Secondary | ICD-10-CM | POA: Diagnosis not present

## 2024-07-04 DIAGNOSIS — D649 Anemia, unspecified: Secondary | ICD-10-CM | POA: Diagnosis not present

## 2024-07-04 DIAGNOSIS — Z940 Kidney transplant status: Secondary | ICD-10-CM | POA: Diagnosis not present

## 2024-07-04 DIAGNOSIS — N1832 Chronic kidney disease, stage 3b: Secondary | ICD-10-CM | POA: Diagnosis not present

## 2024-07-10 DIAGNOSIS — N390 Urinary tract infection, site not specified: Secondary | ICD-10-CM | POA: Diagnosis not present

## 2024-07-10 DIAGNOSIS — N186 End stage renal disease: Secondary | ICD-10-CM | POA: Diagnosis not present

## 2024-07-10 DIAGNOSIS — F320 Major depressive disorder, single episode, mild: Secondary | ICD-10-CM | POA: Diagnosis not present

## 2024-07-10 DIAGNOSIS — Z940 Kidney transplant status: Secondary | ICD-10-CM | POA: Diagnosis not present

## 2024-07-15 DIAGNOSIS — F411 Generalized anxiety disorder: Secondary | ICD-10-CM | POA: Diagnosis not present

## 2024-07-15 DIAGNOSIS — I10 Essential (primary) hypertension: Secondary | ICD-10-CM | POA: Diagnosis not present

## 2024-07-15 DIAGNOSIS — F321 Major depressive disorder, single episode, moderate: Secondary | ICD-10-CM | POA: Diagnosis not present

## 2024-07-15 DIAGNOSIS — F01A0 Vascular dementia, mild, without behavioral disturbance, psychotic disturbance, mood disturbance, and anxiety: Secondary | ICD-10-CM | POA: Diagnosis not present

## 2024-07-15 DIAGNOSIS — F200 Paranoid schizophrenia: Secondary | ICD-10-CM | POA: Diagnosis not present

## 2024-07-25 DIAGNOSIS — Z79899 Other long term (current) drug therapy: Secondary | ICD-10-CM | POA: Diagnosis not present

## 2024-07-29 DIAGNOSIS — F01A0 Vascular dementia, mild, without behavioral disturbance, psychotic disturbance, mood disturbance, and anxiety: Secondary | ICD-10-CM | POA: Diagnosis not present

## 2024-07-29 DIAGNOSIS — I10 Essential (primary) hypertension: Secondary | ICD-10-CM | POA: Diagnosis not present

## 2024-07-29 DIAGNOSIS — F200 Paranoid schizophrenia: Secondary | ICD-10-CM | POA: Diagnosis not present

## 2024-07-29 DIAGNOSIS — F321 Major depressive disorder, single episode, moderate: Secondary | ICD-10-CM | POA: Diagnosis not present

## 2024-07-29 DIAGNOSIS — F411 Generalized anxiety disorder: Secondary | ICD-10-CM | POA: Diagnosis not present

## 2024-07-31 DIAGNOSIS — F320 Major depressive disorder, single episode, mild: Secondary | ICD-10-CM | POA: Diagnosis not present
